# Patient Record
Sex: Male | Born: 1990 | Race: White | Hispanic: No | Marital: Single | Smoking: Never smoker
Health system: Southern US, Community
[De-identification: ages and names within clinical notes are randomized; demographics above are authoritative.]

## PROBLEM LIST (undated history)

## (undated) DIAGNOSIS — Z9889 Other specified postprocedural states: Secondary | ICD-10-CM

## (undated) DIAGNOSIS — S62009A Unspecified fracture of navicular [scaphoid] bone of unspecified wrist, initial encounter for closed fracture: Secondary | ICD-10-CM

## (undated) DIAGNOSIS — R112 Nausea with vomiting, unspecified: Secondary | ICD-10-CM

## (undated) DIAGNOSIS — Z8614 Personal history of Methicillin resistant Staphylococcus aureus infection: Secondary | ICD-10-CM

## (undated) HISTORY — PX: TONSILLECTOMY: SUR1361

---
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
## 2012-12-02 DIAGNOSIS — Z8614 Personal history of Methicillin resistant Staphylococcus aureus infection: Secondary | ICD-10-CM

## 2012-12-02 HISTORY — DX: Personal history of Methicillin resistant Staphylococcus aureus infection: Z86.14

## 2015-01-31 DIAGNOSIS — S62009A Unspecified fracture of navicular [scaphoid] bone of unspecified wrist, initial encounter for closed fracture: Secondary | ICD-10-CM

## 2015-01-31 HISTORY — DX: Unspecified fracture of navicular (scaphoid) bone of unspecified wrist, initial encounter for closed fracture: S62.009A

## 2015-02-04 ENCOUNTER — Emergency Department: Payer: Self-pay | Admitting: Emergency Medicine

## 2015-02-28 ENCOUNTER — Encounter (HOSPITAL_BASED_OUTPATIENT_CLINIC_OR_DEPARTMENT_OTHER): Payer: Self-pay | Admitting: *Deleted

## 2015-02-28 ENCOUNTER — Other Ambulatory Visit: Payer: Self-pay | Admitting: Physician Assistant

## 2015-03-01 NOTE — H&P (Signed)
  Madelin Weseman/[facility] [address]   [address] , [place] [id number] [phone] A Division of [facility]  Loreta Aveaniel F. Kenslee Achorn, M.D.   Robert A. Thurston HoleWainer, M.D.   Burnell BlanksW. Dan Caffrey, M.D.   Eulas PostJoshua P. Landau, M.D.   Lunette StandsAnna Voytek, M.D. Jewel Baizeimothy D. Eulah PontMurphy, M.D.  Buford DresserWesley R. Ibazebo, M.D.  Estell HarpinJames S. Kramer, M.D.    Melina Fiddlerebecca S. Bassett, M.D. Janalee DaneBrittney Kelly, PA- C  Mary L. Dub MikesStanbery, PA-C  Kirstin A. Shepperson, PA-C  Josh [place] Haver[place]hadwell, PA-C  WhitevilleBrandon Parry, [place] Dakota[place]A-C  RE: Baird CancerHumphries, Hilary   [phone]      DOB: May 30, 1991 INITIAL EVALUATION:  02-28-15  HPI: Reuel BoomDaniel is a new patient to the office.  He is a 24 year old male.  He sustained an injury at work on 02/04/2015.  Larey SeatFell off a ladder.  Vertical load dorsiflexion injury left wrist.  [facility] was seen at [facility] and x-rays were obtained showing a displaced fracture scaphoid junction proximal middle third.  He has been in a splint.  He presents today to discuss definitive treatment.  There was some issue about initial Worker's Comp. coverage which has been resolved.  He has already been told that he is going to need operative intervention.  No issues with this wrist prior to this traumatic event.  Otherwise good health.    Remaining history and general exam is outlined and included in the chart.    EXAMINATION: Specifically, he is a very healthy appearing 24 year old male, 6'6", 200 pounds.  Heart: regular rate and rhythm.  Lungs clear.  He has swelling and tenderness over the scaphoid on the left.  No gross deformity.  Neurovascularly intact. Very tender over the snuff box.  Other hand, full motion and no tenderness.    X-RAYS: I looked at his outside films showing a displaced, distracted fracture junction proximal middle third scaphoid.  I also got a scaphoid specific view today.  This remains displaced.  There is also a question of a nondisplaced fracture segmental a little bit further distal.  I can barely  see this on one view.     IMPRESSION & DISPOSITION: The nature of the injury and need for treatment outlined.  This is at high risk for nonunion as well as avascular necrosis no matter what is done.  The sooner this is reduced as much anatomically as possible and fixed the better his outcome.  I have discussed this with him.  My plan is to use a volar approach.  Try to this as much percutaneously as possible.  Unfortunately to get this aligned and reduced appropriately, I may have to open the wrist capsule.  Buried Acutrak screw.  Even with exacting, reduction and fixation, I remain concerned about how long this is going to take to heal as well as avascular necrosis.  The plan is to try to do this later this week.  Out of work from now until 6 weeks postoperatively.  Depending on the nature of the bone quality and how good fixation I can get, I will make a decision about clearance to return to light duty not using this hand at all prior to 6 weeks post-op.  I covered all of this with him.  Paperwork completed and all questions answered.  I will see him at the time of operative intervention.     Loreta Aveaniel F. Dellis Voght, M.D. Electronically verified by Loreta Aveaniel F. Chinelo Benn, M.D.  DFM:gde D 02-28-15 T 02-28-15 Cc:  Worker's Comp.

## 2015-03-02 ENCOUNTER — Encounter (HOSPITAL_BASED_OUTPATIENT_CLINIC_OR_DEPARTMENT_OTHER): Payer: Self-pay

## 2015-03-02 ENCOUNTER — Ambulatory Visit (HOSPITAL_BASED_OUTPATIENT_CLINIC_OR_DEPARTMENT_OTHER): Payer: Worker's Compensation | Admitting: Anesthesiology

## 2015-03-02 ENCOUNTER — Ambulatory Visit (HOSPITAL_BASED_OUTPATIENT_CLINIC_OR_DEPARTMENT_OTHER)
Admission: RE | Admit: 2015-03-02 | Discharge: 2015-03-02 | Disposition: A | Discharge status: Home / Self Care | Payer: Worker's Compensation | Source: Ambulatory Visit | Attending: Orthopedic Surgery | Admitting: Orthopedic Surgery

## 2015-03-02 ENCOUNTER — Encounter (HOSPITAL_BASED_OUTPATIENT_CLINIC_OR_DEPARTMENT_OTHER)
Admission: RE | Disposition: A | Discharge status: Home / Self Care | Payer: Self-pay | Source: Ambulatory Visit | Attending: Orthopedic Surgery

## 2015-03-02 DIAGNOSIS — F17210 Nicotine dependence, cigarettes, uncomplicated: Secondary | ICD-10-CM | POA: Insufficient documentation

## 2015-03-02 DIAGNOSIS — S62032A Displaced fracture of proximal third of navicular [scaphoid] bone of left wrist, initial encounter for closed fracture: Secondary | ICD-10-CM | POA: Insufficient documentation

## 2015-03-02 DIAGNOSIS — W11XXXA Fall on and from ladder, initial encounter: Secondary | ICD-10-CM | POA: Diagnosis not present

## 2015-03-02 HISTORY — DX: Unspecified fracture of navicular (scaphoid) bone of unspecified wrist, initial encounter for closed fracture: S62.009A

## 2015-03-02 HISTORY — DX: Personal history of Methicillin resistant Staphylococcus aureus infection: Z86.14

## 2015-03-02 HISTORY — DX: Other specified postprocedural states: Z98.890

## 2015-03-02 HISTORY — DX: Nausea with vomiting, unspecified: R11.2

## 2015-03-02 HISTORY — PX: ORIF SCAPHOID FRACTURE: SHX2130

## 2015-03-02 SURGERY — OPEN REDUCTION INTERNAL FIXATION (ORIF) SCAPHOID FRACTURE
Anesthesia: Regional | Site: Wrist | Laterality: Left

## 2015-03-02 MED ORDER — BUPIVACAINE-EPINEPHRINE (PF) 0.5% -1:200000 IJ SOLN
INTRAMUSCULAR | Status: DC | PRN
  Administered 2015-03-02: 25 mL via PERINEURAL

## 2015-03-02 MED ORDER — LACTATED RINGERS IV SOLN
INTRAVENOUS | Status: DC
  Administered 2015-03-02: 09:00:00 via INTRAVENOUS

## 2015-03-02 MED ORDER — ONDANSETRON HCL 4 MG/2ML IJ SOLN: 4.0000 mg | Freq: Once | INTRAMUSCULAR | Status: DC | PRN

## 2015-03-02 MED ORDER — PROPOFOL 10 MG/ML IV BOLUS
INTRAVENOUS | Status: AC
  Filled 2015-03-02: qty 20

## 2015-03-02 MED ORDER — MIDAZOLAM HCL 2 MG/2ML IJ SOLN
INTRAMUSCULAR | Status: AC
  Filled 2015-03-02: qty 2

## 2015-03-02 MED ORDER — METOCLOPRAMIDE HCL 5 MG/ML IJ SOLN
5.0000 mg | Freq: Three times a day (TID) | INTRAMUSCULAR | Status: DC | PRN
Start: 2015-03-02 — End: 2015-03-02

## 2015-03-02 MED ORDER — HYDROMORPHONE HCL 1 MG/ML IJ SOLN: 0.2500 mg | INTRAMUSCULAR | Status: DC | PRN

## 2015-03-02 MED ORDER — DEXAMETHASONE SODIUM PHOSPHATE 4 MG/ML IJ SOLN
INTRAMUSCULAR | Status: DC | PRN
  Administered 2015-03-02: 10 mg via INTRAVENOUS

## 2015-03-02 MED ORDER — CEFAZOLIN SODIUM-DEXTROSE 2-3 GM-% IV SOLR
2.0000 g | INTRAVENOUS | Status: AC
  Administered 2015-03-02: 2 g via INTRAVENOUS

## 2015-03-02 MED ORDER — METOCLOPRAMIDE HCL 5 MG PO TABS: 5.0000 mg | ORAL_TABLET | Freq: Three times a day (TID) | ORAL | Status: DC | PRN

## 2015-03-02 MED ORDER — CEFAZOLIN SODIUM-DEXTROSE 2-3 GM-% IV SOLR
INTRAVENOUS | Status: AC
  Filled 2015-03-02: qty 50

## 2015-03-02 MED ORDER — ONDANSETRON HCL 4 MG/2ML IJ SOLN
INTRAMUSCULAR | Status: DC | PRN
  Administered 2015-03-02: 4 mg via INTRAVENOUS

## 2015-03-02 MED ORDER — PROPOFOL 10 MG/ML IV BOLUS
INTRAVENOUS | Status: DC | PRN
  Administered 2015-03-02: 300 mg via INTRAVENOUS
  Administered 2015-03-02: 100 mg via INTRAVENOUS

## 2015-03-02 MED ORDER — LIDOCAINE HCL (CARDIAC) 20 MG/ML IV SOLN
INTRAVENOUS | Status: DC | PRN
  Administered 2015-03-02: 80 mg via INTRAVENOUS

## 2015-03-02 MED ORDER — FENTANYL CITRATE 0.05 MG/ML IJ SOLN
50.0000 ug | INTRAMUSCULAR | Status: DC | PRN
  Administered 2015-03-02: 100 ug via INTRAVENOUS

## 2015-03-02 MED ORDER — ONDANSETRON HCL 4 MG PO TABS: 4.0000 mg | ORAL_TABLET | Freq: Four times a day (QID) | ORAL | Status: DC | PRN

## 2015-03-02 MED ORDER — ONDANSETRON HCL 4 MG/2ML IJ SOLN: 4.0000 mg | Freq: Four times a day (QID) | INTRAMUSCULAR | Status: DC | PRN

## 2015-03-02 MED ORDER — FENTANYL CITRATE 0.05 MG/ML IJ SOLN
INTRAMUSCULAR | Status: AC
  Filled 2015-03-02: qty 2

## 2015-03-02 MED ORDER — CHLORHEXIDINE GLUCONATE 4 % EX LIQD
60.0000 mL | Freq: Once | CUTANEOUS | Status: DC
Start: 2015-03-02 — End: 2015-03-02

## 2015-03-02 MED ORDER — FENTANYL CITRATE 0.05 MG/ML IJ SOLN
INTRAMUSCULAR | Status: AC
  Filled 2015-03-02: qty 6

## 2015-03-02 MED ORDER — MIDAZOLAM HCL 2 MG/ML PO SYRP: 12.0000 mg | ORAL_SOLUTION | Freq: Once | ORAL | Status: DC | PRN

## 2015-03-02 MED ORDER — MIDAZOLAM HCL 2 MG/2ML IJ SOLN
1.0000 mg | INTRAMUSCULAR | Status: DC | PRN
  Administered 2015-03-02: 2 mg via INTRAVENOUS

## 2015-03-02 MED ORDER — OXYCODONE HCL 5 MG/5ML PO SOLN: 5.0000 mg | Freq: Once | ORAL | Status: DC | PRN

## 2015-03-02 MED ORDER — LACTATED RINGERS IV SOLN
INTRAVENOUS | Status: DC
  Administered 2015-03-02 (×2): via INTRAVENOUS

## 2015-03-02 MED ORDER — OXYCODONE-ACETAMINOPHEN 5-325 MG PO TABS
1.0000 | ORAL_TABLET | ORAL | Status: DC | PRN
Start: 2015-03-02 — End: 2015-03-02

## 2015-03-02 MED ORDER — HYDROMORPHONE HCL 1 MG/ML IJ SOLN
0.5000 mg | INTRAMUSCULAR | Status: DC | PRN
Start: 2015-03-02 — End: 2015-03-02

## 2015-03-02 MED ORDER — OXYCODONE HCL 5 MG PO TABS: 5.0000 mg | ORAL_TABLET | Freq: Once | ORAL | Status: DC | PRN

## 2015-03-02 SURGICAL SUPPLY — 64 items
BANDAGE ELASTIC 3 VELCRO ST LF (GAUZE/BANDAGES/DRESSINGS) ×2 IMPLANT
BANDAGE ELASTIC 4 VELCRO ST LF (GAUZE/BANDAGES/DRESSINGS) IMPLANT
BIT DRILL MINI LNG ACUTRAK 2 (BIT) ×1 IMPLANT
BLADE SURG 15 STRL LF DISP TIS (BLADE) ×1 IMPLANT
BLADE SURG 15 STRL SS (BLADE) ×1
BNDG COHESIVE 3X5 TAN STRL LF (GAUZE/BANDAGES/DRESSINGS) ×2 IMPLANT
BNDG ESMARK 4X9 LF (GAUZE/BANDAGES/DRESSINGS) ×2 IMPLANT
CANISTER SUCT 1200ML W/VALVE (MISCELLANEOUS) ×2 IMPLANT
COVER BACK TABLE 60X90IN (DRAPES) ×2 IMPLANT
CUFF TOURNIQUET SINGLE 18IN (TOURNIQUET CUFF) ×2 IMPLANT
DECANTER SPIKE VIAL GLASS SM (MISCELLANEOUS) IMPLANT
DRAPE EXTREMITY T 121X128X90 (DRAPE) ×2 IMPLANT
DRAPE OEC MINIVIEW 54X84 (DRAPES) ×2 IMPLANT
DRAPE SURG 17X23 STRL (DRAPES) ×2 IMPLANT
DRAPE U-SHAPE 47X51 STRL (DRAPES) IMPLANT
DRILL MINI LNG ACUTRAK 2 (BIT) ×2
DURAPREP 26ML APPLICATOR (WOUND CARE) ×2 IMPLANT
ELECT REM PT RETURN 9FT ADLT (ELECTROSURGICAL) ×2
ELECTRODE REM PT RTRN 9FT ADLT (ELECTROSURGICAL) ×1 IMPLANT
GAUZE SPONGE 4X4 12PLY STRL (GAUZE/BANDAGES/DRESSINGS) ×2 IMPLANT
GAUZE XEROFORM 1X8 LF (GAUZE/BANDAGES/DRESSINGS) ×2 IMPLANT
GLOVE BIOGEL M STRL SZ7.5 (GLOVE) ×2 IMPLANT
GLOVE BIOGEL PI IND STRL 7.0 (GLOVE) ×1 IMPLANT
GLOVE BIOGEL PI IND STRL 8 (GLOVE) ×1 IMPLANT
GLOVE BIOGEL PI INDICATOR 7.0 (GLOVE) ×1
GLOVE BIOGEL PI INDICATOR 8 (GLOVE) ×1
GLOVE ECLIPSE 7.0 STRL STRAW (GLOVE) ×2 IMPLANT
GLOVE ORTHO TXT STRL SZ7.5 (GLOVE) ×4 IMPLANT
GLOVE SURG ORTHO 8.0 STRL STRW (GLOVE) ×2 IMPLANT
GOWN STRL REUS W/ TWL LRG LVL3 (GOWN DISPOSABLE) ×2 IMPLANT
GOWN STRL REUS W/ TWL XL LVL3 (GOWN DISPOSABLE) ×2 IMPLANT
GOWN STRL REUS W/TWL LRG LVL3 (GOWN DISPOSABLE) ×2
GOWN STRL REUS W/TWL XL LVL3 (GOWN DISPOSABLE) ×4 IMPLANT
GUIDEWIRE ORTHO MINI ACTK .045 (WIRE) ×6 IMPLANT
NEEDLE HYPO 25X1 1.5 SAFETY (NEEDLE) IMPLANT
NS IRRIG 1000ML POUR BTL (IV SOLUTION) ×2 IMPLANT
PACK BASIN DAY SURGERY FS (CUSTOM PROCEDURE TRAY) ×2 IMPLANT
PAD CAST 3X4 CTTN HI CHSV (CAST SUPPLIES) ×2 IMPLANT
PAD CAST 4YDX4 CTTN HI CHSV (CAST SUPPLIES) IMPLANT
PADDING CAST ABS 4INX4YD NS (CAST SUPPLIES) ×1
PADDING CAST ABS COTTON 4X4 ST (CAST SUPPLIES) ×1 IMPLANT
PADDING CAST COTTON 3X4 STRL (CAST SUPPLIES) ×2
PADDING CAST COTTON 4X4 STRL (CAST SUPPLIES)
PENCIL BUTTON HOLSTER BLD 10FT (ELECTRODE) ×2 IMPLANT
SCREW ACUTRAK 2 MINI 26MM (Screw) ×2 IMPLANT
SHEET MEDIUM DRAPE 40X70 STRL (DRAPES) ×2 IMPLANT
SLEEVE SCD COMPRESS KNEE MED (MISCELLANEOUS) ×2 IMPLANT
SLING ARM FOAM STRAP LRG (SOFTGOODS) ×2 IMPLANT
SPLINT PLASTER CAST XFAST 3X15 (CAST SUPPLIES) IMPLANT
SPLINT PLASTER XTRA FASTSET 3X (CAST SUPPLIES)
SPONGE LAP 4X18 X RAY DECT (DISPOSABLE) IMPLANT
STAPLER VISISTAT 35W (STAPLE) IMPLANT
STOCKINETTE 4X48 STRL (DRAPES) ×2 IMPLANT
SUCTION FRAZIER TIP 10 FR DISP (SUCTIONS) IMPLANT
SUT ETHILON 3 0 PS 1 (SUTURE) IMPLANT
SUT VIC AB 2-0 SH 27 (SUTURE) ×1
SUT VIC AB 2-0 SH 27XBRD (SUTURE) ×1 IMPLANT
SUT VIC AB 3-0 SH 27 (SUTURE)
SUT VIC AB 3-0 SH 27X BRD (SUTURE) IMPLANT
SYR BULB 3OZ (MISCELLANEOUS) IMPLANT
SYR CONTROL 10ML LL (SYRINGE) IMPLANT
TOWEL OR 17X24 6PK STRL BLUE (TOWEL DISPOSABLE) ×2 IMPLANT
TUBE CONNECTING 20X1/4 (TUBING) IMPLANT
UNDERPAD 30X30 INCONTINENT (UNDERPADS AND DIAPERS) ×2 IMPLANT

## 2015-03-02 NOTE — Discharge Instructions (Signed)
Wear sling until block has worn off and thereafter for comfort.  Do not remove splint.  May shower, but do not let splint get wet.  Follow up appointment in one week   If you have a plaster or fiberglass splint or cast:  Do not try to scratch the skin under the cast using sharp or pointed objects.  Check the skin around the cast every day. You may put lotion on any red or sore areas.  Keep your cast or splint dry and clean.  Do not put pressure on any part of your cast or splint until it is fully hardened.  Your cast or splint can be protected during bathing with a plastic bag. Do not lower the cast or splint into water.  Take prescribed medication as directed. Only take over-the-counter or prescription medicines for pain, discomfort, or fever as directed by your caregiver. SEEK IMMEDIATE MEDICAL CARE IF:  You develop redness, swelling, numbness or increasing pain in the wound.  There is pus coming from the wound.  An unexplained oral temperature above 102 F (38.9 C) develops.  A bad smell is coming from the wound or dressing.  A breaking open of the wound (edges not staying together) occurs after stitches or staples have been removed. If you do not have a window in your cast for observing the wound, a discharge or minor bleeding may show up as a stain on the outside of your cast immediately after surgery. Report these findings to your caregiver. Document Released: 06/07/2005 Document Revised: 09/08/2013 Document Reviewed: 05/30/2009 [facility] Patient Information 2015 [place], [place]. This information is not intended to replace advice given to you by your health care provider. Make sure you discuss any questions you have with your health care provider.    Post Anesthesia Home Care Instructions  Activity: Get plenty of rest for the remainder of the day. A responsible adult should stay with you for 24 hours following the procedure.  For the next 24 hours, DO NOT: -Drive a  car -Advertising copywriter -Drink alcoholic beverages -Take any medication unless instructed by your physician -Make any legal decisions or sign important papers.  Meals: Start with liquid foods such as gelatin or soup. Progress to regular foods as tolerated. Avoid greasy, spicy, heavy foods. If nausea and/or vomiting occur, drink only clear liquids until the nausea and/or vomiting subsides. Call your physician if vomiting continues.  Special Instructions/Symptoms: Your throat may feel dry or sore from the anesthesia or the breathing tube placed in your throat during surgery. If this causes discomfort, gargle with warm salt water. The discomfort should disappear within 24 hours.  If you had a scopolamine patch placed behind your ear for the management of post- operative nausea and/or vomiting:  1. The medication in the patch is effective for 72 hours, after which it should be removed.  Wrap patch in a tissue and discard in the trash. Wash hands thoroughly with soap and water. 2. You may remove the patch earlier than 72 hours if you experience unpleasant side effects which may include dry mouth, dizziness or visual disturbances. 3. Avoid touching the patch. Wash your hands with soap and water after contact with the patch.   Regional Anesthesia Blocks  1. Numbness or the inability to move the "blocked" extremity may last from 3-48 hours after placement. The length of time depends on the medication injected and your individual response to the medication. If the numbness is not going away after 48 hours, call your surgeon.  2. The extremity that is blocked will need to be protected until the numbness is gone and the  Strength has returned. Because you cannot feel it, you will need to take extra care to avoid injury. Because it may be weak, you may have difficulty moving it or using it. You may not know what position it is in without looking at it while the block is in effect.  3. For blocks in the  legs and feet, returning to weight bearing and walking needs to be done carefully. You will need to wait until the numbness is entirely gone and the strength has returned. You should be able to move your leg and foot normally before you try and bear weight or walk. You will need someone to be with you when you first try to ensure you do not fall and possibly risk injury.  4. Bruising and tenderness at the needle site are common side effects and will resolve in a few days.  5. Persistent numbness or new problems with movement should be communicated to the surgeon or the [facility] [phone])/ [facility] [phone]).

## 2015-03-02 NOTE — Anesthesia Postprocedure Evaluation (Signed)
  Anesthesia Post-op Note  Patient: Derek [name]  Procedure(s) Performed: Procedure(s) with comments: LEFT WRIST OPEN REDUCTION INTERNAL FIXATION (ORIF) SCAPHOID  (Left) - ANESTHESIA: GENERAL, BLOCK  Patient Location: PACU  Anesthesia Type: General, Regional   Level of Consciousness: awake, alert  and oriented  Airway and Oxygen Therapy: Patient Spontanous Breathing  Post-op Pain: mild  Post-op Assessment: Post-op Vital signs reviewed  Post-op Vital Signs: Reviewed  Last Vitals:  Filed Vitals:   03/02/15 1201  BP:   Pulse: 86  Temp: 36.8 C  Resp: 15    Complications: No apparent anesthesia complications

## 2015-03-02 NOTE — Anesthesia Procedure Notes (Addendum)
Anesthesia Regional Block:  Supraclavicular block  Pre-Anesthetic Checklist: ,, timeout performed, Correct Patient, Correct Site, Correct Laterality, Correct Procedure, Correct Position, site marked, Risks and benefits discussed,  Surgical consent,  Pre-op evaluation,  At surgeon's request and post-op pain management  Laterality: Left and Upper  Prep: chloraprep       Needles:  Injection technique: Single-shot  Needle Type: Echogenic Stimulator Needle     Needle Length: 5cm 5 cm Needle Gauge: 21 and 21 G    Additional Needles:  Procedures: ultrasound guided (picture in chart) Supraclavicular block Narrative:  Start time: 03/02/2015 9:20 AM End time: 03/02/2015 9:26 AM Injection made incrementally with aspirations every 5 mL.  Performed by: Personally  Anesthesiologist: CREWS, DAVID   Procedure Name: LMA Insertion Date/Time: 03/02/2015 11:08 AM Performed by: Curly ShoresRAFT, Friedrich Harriott W Pre-anesthesia Checklist: Patient identified, Emergency Drugs available, Suction available and Patient being monitored Patient Re-evaluated:Patient Re-evaluated prior to inductionOxygen Delivery Method: Circle System Utilized Preoxygenation: Pre-oxygenation with 100% oxygen Intubation Type: IV induction Ventilation: Mask ventilation without difficulty LMA: LMA inserted LMA Size: 5.0 Number of attempts: 2 Airway Equipment and Method: Bite block Placement Confirmation: positive ETCO2 and breath sounds checked- equal and bilateral Tube secured with: Tape Dental Injury: Teeth and Oropharynx as per pre-operative assessment

## 2015-03-02 NOTE — Anesthesia Preprocedure Evaluation (Signed)
Anesthesia Evaluation  Patient identified by MRN, date of birth, ID band  Reviewed: Allergy & Precautions, NPO status , Patient's Chart, lab work & pertinent test results  Airway Mallampati: I  TM Distance: >3 FB Neck ROM: Full    Dental  (+) Teeth Intact, Dental Advisory Given   Pulmonary Current Smoker,  breath sounds clear to auscultation        Cardiovascular Rhythm:Regular Rate:Normal     Neuro/Psych    GI/Hepatic   Endo/Other    Renal/GU      Musculoskeletal   Abdominal   Peds  Hematology   Anesthesia Other Findings   Reproductive/Obstetrics                             Anesthesia Physical Anesthesia Plan  ASA: I  Anesthesia Plan: General   Post-op Pain Management:    Induction: Intravenous  Airway Management Planned: LMA  Additional Equipment:   Intra-op Plan:   Post-operative Plan: Extubation in OR  Informed Consent: I have reviewed the patients History and Physical, chart, labs and discussed the procedure including the risks, benefits and alternatives for the proposed anesthesia with the patient or authorized representative who has indicated his/her understanding and acceptance.   Dental advisory given  Plan Discussed with: CRNA, Anesthesiologist and Surgeon  Anesthesia Plan Comments:         Anesthesia Quick Evaluation

## 2015-03-02 NOTE — Interval H&P Note (Signed)
History and Physical Interval Note:  03/02/2015 8:41 AM  Derek [name]  has presented today for surgery, with the diagnosis of UNSPECIFIED FRACTURE OF NAVICULAR (SCAPHOID) BONE OF UNSPECIFIED WRIST INITIAL ENCOUNTER FOR CLOSED FRACTURE  The various methods of treatment have been discussed with the patient and family. After consideration of risks, benefits and other options for treatment, the patient has consented to  Procedure(s) with comments: LEFT WRIST OPEN REDUCTION INTERNAL FIXATION (ORIF) SCAPHOID  (Left) - ANESTHESIA: GENERAL, BLOCK as a surgical intervention .  The patient's history has been reviewed, patient examined, no change in status, stable for surgery.  I have reviewed the patient's chart and labs.  Questions were answered to the patient's satisfaction.     Brendyn Mclaren,Xaviar F

## 2015-03-02 NOTE — Progress Notes (Signed)
Assisted Dr. Crews with left, ultrasound guided, interscalene  block. Side rails up, monitors on throughout procedure. See vital signs in flow sheet. Tolerated Procedure well. 

## 2015-03-02 NOTE — Transfer of Care (Signed)
Immediate Anesthesia Transfer of Care Note  Patient: Lovell SheehanDaniel T Delaluz  Procedure(s) Performed: Procedure(s) with comments: LEFT WRIST OPEN REDUCTION INTERNAL FIXATION (ORIF) SCAPHOID  (Left) - ANESTHESIA: GENERAL, BLOCK  Patient Location: PACU  Anesthesia Type:GA combined with regional for post-op pain  Level of Consciousness: awake, alert  and oriented  Airway & Oxygen Therapy: Patient Spontanous Breathing and Patient connected to face mask oxygen  Post-op Assessment: Report given to RN, Post -op Vital signs reviewed and stable and Patient moving all extremities  Post vital signs: Reviewed and stable  Last Vitals:  Filed Vitals:   03/02/15 1201  BP:   Pulse: 86  Temp: 36.8 C  Resp: 15    Complications: No apparent anesthesia complications

## 2015-03-03 NOTE — Op Note (Signed)
NAMGlean Salen:  [name], Derek             ACCOUNT NO.:  [id number]  MEDICAL RECORD NO.:  [id number]  LOCATION:                                 FACILITY:  PHYSICIAN:  Loreta Aveaniel F. Murphy, M.D. DATE OF BIRTH:  12-Aug-1991  DATE OF PROCEDURE:  03/02/2015 DATE OF DISCHARGE:  03/02/2015                              OPERATIVE REPORT   PREOPERATIVE DIAGNOSIS:  Displaced fracture, scaphoid left wrist. Closed.  Junction proximal middle third.  POSTOPERATIVE DIAGNOSIS:  Displaced fracture, scaphoid left wrist. Closed.  Junction proximal middle third.  PROCEDURE:  Open reduction internal fixation with a buried 26 mm cannulated mini Acutrak screw.  SURGEON:  Loreta Aveaniel F. Murphy, MD.  ASSISTANT:  Mikey KirschnerLindsey Stanberry, PA-C  ANESTHESIA:  General.  BLOOD LOSS:  Minimal.  SPECIMENS:  None.  CULTURES:  None.  COMPLICATIONS:  None.  DRESSINGS:  Soft compressive thumb spica splint.  TOURNIQUET TIME:  45 minutes.  DESCRIPTION OF PROCEDURE:  The patient was brought to the operating room, placed on the operating table in supine position.  After adequate anesthesia had been obtained, tourniquet applied.  Prepped and draped in usual sterile fashion.  Fluoroscopic guidance throughout.  I was able to manipulate and reduce the fracture into very good position.  This was approached with a volar dorsal incision.  Skin and subcutaneous tissue divided.  Holding the fracture reduced.  The guidewire was passed all across the fracture as far proximal as possible as this was a very proximal fracture.  After determining good alignment and measuring and drilling, it was then fixed with a 26 mm screw countersunk.  This gave good capturing, good fixation, good compression.  Placing as many __________ screw into the fracture fragment as possible.  Very pleased with alignment and stability.  Wound irrigated and closed with nylon. Sterile compressive dressing applied.  Tourniquet inflated and removed. Thumb spica splint  applied.  Anesthesia reversed.  Brought to the recovery room.  Tolerated the surgery well.  No complications.     Loreta Aveaniel F. Murphy, M.D.     DFM/MEDQ  D:  03/02/2015  T:  03/03/2015  Job:  [id number]

## 2015-12-27 DIAGNOSIS — S62009A Unspecified fracture of navicular [scaphoid] bone of unspecified wrist, initial encounter for closed fracture: Secondary | ICD-10-CM | POA: Insufficient documentation

## 2016-01-03 ENCOUNTER — Emergency Department
Admission: EM | Admit: 2016-01-03 | Discharge: 2016-01-03 | Disposition: A | Discharge status: Home / Self Care | Payer: 59 | Attending: Emergency Medicine | Admitting: Emergency Medicine

## 2016-01-03 ENCOUNTER — Encounter: Payer: Self-pay | Admitting: Emergency Medicine

## 2016-01-03 DIAGNOSIS — R110 Nausea: Secondary | ICD-10-CM

## 2016-01-03 DIAGNOSIS — F17210 Nicotine dependence, cigarettes, uncomplicated: Secondary | ICD-10-CM | POA: Insufficient documentation

## 2016-01-03 DIAGNOSIS — K5900 Constipation, unspecified: Secondary | ICD-10-CM | POA: Diagnosis not present

## 2016-01-03 DIAGNOSIS — R1084 Generalized abdominal pain: Secondary | ICD-10-CM | POA: Diagnosis not present

## 2016-01-03 DIAGNOSIS — R109 Unspecified abdominal pain: Secondary | ICD-10-CM | POA: Diagnosis present

## 2016-01-03 DIAGNOSIS — R1013 Epigastric pain: Secondary | ICD-10-CM

## 2016-01-03 LAB — URINALYSIS COMPLETE WITH MICROSCOPIC (ARMC ONLY)
Bilirubin Urine: NEGATIVE
Glucose, UA: NEGATIVE mg/dL
HGB URINE DIPSTICK: NEGATIVE
Ketones, ur: NEGATIVE mg/dL
Leukocytes, UA: NEGATIVE
Nitrite: NEGATIVE
PH: 6 (ref 5.0–8.0)
PROTEIN: NEGATIVE mg/dL
Specific Gravity, Urine: 1.026 (ref 1.005–1.030)

## 2016-01-03 LAB — COMPREHENSIVE METABOLIC PANEL
ALBUMIN: 4.8 g/dL (ref 3.5–5.0)
ALT: 15 U/L — ABNORMAL LOW (ref 17–63)
AST: 18 U/L (ref 15–41)
Alkaline Phosphatase: 63 U/L (ref 38–126)
Anion gap: 6 (ref 5–15)
BILIRUBIN TOTAL: 0.7 mg/dL (ref 0.3–1.2)
BUN: 12 mg/dL (ref 6–20)
CALCIUM: 9.3 mg/dL (ref 8.9–10.3)
CO2: 29 mmol/L (ref 22–32)
Chloride: 104 mmol/L (ref 101–111)
Creatinine, Ser: 0.95 mg/dL (ref 0.61–1.24)
GFR calc Af Amer: >60 mL/min (ref >60)
GLUCOSE: 97 mg/dL (ref 65–99)
POTASSIUM: 3.9 mmol/L (ref 3.5–5.1)
Sodium: 139 mmol/L (ref 135–145)
Total Protein: 7.6 g/dL (ref 6.5–8.1)

## 2016-01-03 LAB — CBC
HCT: 40.5 % (ref 40.0–52.0)
HEMOGLOBIN: 14 g/dL (ref 13.0–18.0)
MCH: 30.5 pg (ref 26.0–34.0)
MCHC: 34.5 g/dL (ref 32.0–36.0)
MCV: 88.5 fL (ref 80.0–100.0)
Platelets: 229 10*3/uL (ref 150–440)
RBC: 4.58 MIL/uL (ref 4.40–5.90)
RDW: 12.7 % (ref 11.5–14.5)
WBC: 7.9 10*3/uL (ref 3.8–10.6)

## 2016-01-03 LAB — LIPASE, BLOOD: Lipase: 30 U/L (ref 11–51)

## 2016-01-03 MED ORDER — GI COCKTAIL ~~LOC~~
30.0000 mL | Freq: Once | ORAL | Status: AC
  Administered 2016-01-03: 30 mL via ORAL
  Filled 2016-01-03 (×2): qty 30

## 2016-01-03 MED ORDER — ONDANSETRON 4 MG PO TBDP: 4.0000 mg | ORAL_TABLET | Freq: Three times a day (TID) | ORAL | Status: DC | PRN

## 2016-01-03 MED ORDER — OMEPRAZOLE 40 MG PO CPDR: 40.0000 mg | DELAYED_RELEASE_CAPSULE | Freq: Every day | ORAL | Status: DC

## 2016-01-03 NOTE — ED Notes (Signed)
Pt in with co generalized abd pain since Sunday no BM since Saturday.  No vomiting, no dysuria.

## 2016-01-03 NOTE — ED Provider Notes (Signed)
[facility] Emergency Department Provider Note  ____________________________________________  Time seen: Approximately 3:13 PM  I have reviewed the triage vital signs and the nursing notes.   HISTORY  Chief Complaint Abdominal Pain    HPI Derek [name] is a 25 y.o. male , otherwise healthy, presenting with epigastric pain x 5d.  Patient reports a constant "dull and sharp" pain in the epigastrium since Saturday. It is worse with eating but is unchanged with laying down.He has associated belching, although the belching does not improve the pain. He has had no nausea or vomiting. Last BM as Saturday but he is passing gas. He tried a laxative and a stool softener without improvement.No fever, chills, chest pain or shortness of breath, lightheadedness or syncope. Patient has been eating and drinking normally, but states that when he swallows he feels like "things might get stuck."   Past Medical History  Diagnosis Date  . PONV (postoperative nausea and vomiting)   . Fracture of scaphoid bone of wrist 01/2015    left navicular(scaphoid) fx.  . History of MRSA infection 2014    knee    There are no active problems to display for this patient.   Past Surgical History  Procedure Laterality Date  . Tonsillectomy    . Orif scaphoid fracture Left 03/02/2015    Procedure: LEFT WRIST OPEN REDUCTION INTERNAL FIXATION (ORIF) SCAPHOID ;  Surgeon: Mckinley Jewel, MD;  Location: [facility];  Service: Orthopedics;  Laterality: Left;  ANESTHESIA: GENERAL, BLOCK    Current Outpatient Rx  Name  Route  Sig  Dispense  Refill  . omeprazole (PRILOSEC) 40 MG capsule   Oral   Take 1 capsule (40 mg total) by mouth daily.   30 capsule   0   . ondansetron (ZOFRAN ODT) 4 MG disintegrating tablet   Oral   Take 1 tablet (4 mg total) by mouth every 8 (eight) hours as needed for nausea or vomiting.   20 tablet   0     Allergies Review of patient's allergies  indicates no known allergies.  History reviewed. No pertinent family history.  Social History Social History  Substance Use Topics  . Smoking status: Current Every Day Smoker -- 3 years    Types: Cigarettes  . Smokeless tobacco: Never Used     Comment: 1-2 cig./day  . Alcohol Use: Yes     Comment: once/week    Review of Systems Constitutional: No fever/chills No lightheadedness or syncope. Eyes: No visual changes. ENT: No sore throat. Cardiovascular: Denies chest pain, palpitations. Respiratory: Denies shortness of breath.  No cough. Gastrointestinal: Positive epigastric abdominal pain.  No nausea, no vomiting.  No diarrhea.  positiveconstipation. Genitourinary: Negative for dysuria.no scrotal swelling or pain. No discharge from the penis. Musculoskeletal: Negative for back pain. Skin: Negative for rash. Neurological: Negative for headaches, focal weakness or numbness.  10-point ROS otherwise negative.  ____________________________________________   PHYSICAL EXAM:  VITAL SIGNS: ED Triage Vitals  Enc Vitals Group     BP 01/03/16 1054 119/83 mmHg     Pulse Rate 01/03/16 1054 78     Resp 01/03/16 1054 18     Temp 01/03/16 1054 98 F (36.7 C)     Temp Source 01/03/16 1054 Oral     SpO2 01/03/16 1054 100 %     Weight 01/03/16 1054 215 lb (97.523 kg)     Height 01/03/16 1054  (1.981 m)     Head Cir --  Peak Flow --      Pain Score 01/03/16 1058 7     Pain Loc --      Pain Edu? --      Excl. in GC? --     Constitutional: Alert and oriented. Well appearing and in no acute distress. Answer question appropriately.moves about the room comfortably. Eyes: Conjunctivae are normal.  EOMI.o scleral icterus Head: Atraumatic. Nose: No congestion/rhinnorhea. Mouth/Throat: Mucous membranes are moist.  Neck: No stridor.  Supple.   Cardiovascular: Normal rate, regular rhythm. No murmurs, rubs or gallops.  Respiratory: Normal respiratory effort.  No retractions. Lungs  CTAB.  No wheezes, rales or ronchi. Gastrointestinal: abdomen is soft and nondistended. Patient has epigastric greater than left upper and left lower quadrant pain. No peritoneal signs, guarding or rebound. No Murphy sign. Musculoskeletal: No LE edema.  Neurologic:  Normal speech and language. No gross focal neurologic deficits are appreciated.  Skin:  Skin is warm, dry and intact. No rash noted. Psychiatric: Mood and affect are normal. Speech and behavior are normal.  Normal judgement.  ____________________________________________   LABS (all labs ordered are listed, but only abnormal results are displayed)  Labs Reviewed - No data to display ____________________________________________  EKG  See note below. ____________________________________________  RADIOLOGY  No results found.  ____________________________________________   PROCEDURES  Procedure(s) performed: None  Critical Care performed: No ____________________________________________   INITIAL IMPRESSION / ASSESSMENT AND PLAN / ED COURSE  Pertinent labs & imaging results that were available during my care of the patient were reviewed by me and considered in my medical decision making (see chart for details).  25 y.o. M, otherwise healthy, presenting w/ epigastric pain x 5d.  Given the pt pain is worse with food and he has a sensation of things getting stuck, it is possible that this patient has GERD or PUD.  The pt does have constipation which may explain his LLQ ttp - I have offered an enema, but he has refused.  There is no evidence for acute surgical pathology on my exam.  Plan basic labs, and re-evaluation about symptomatic management.  Anticipate d/c home w/ GERD precautionary diet, PPI, and close PMD f/u.  ----------------------------------------- 4:15 PM on 01/03/2016 -----------------------------------------  The patient had blood work and an EKG performed when he registered to be seen yesterday. He did  not see a physician, but his EKG shows a rate in the 70s with an incomplete right bundle-branch block but otherwise no ischemic changes. His lab work is all within normal limits including blood counts, and electrolytes. He also has a urinalysis was does not show any evidence of infection. At this time, I'll plan to discharge the patient home with treatment for GERD, and have him follow-up with his primary care physician in the next 5-7 days. He understands return precautions as well as follow-up instructions.  ____________________________________________  FINAL CLINICAL IMPRESSION(S) / ED DIAGNOSES  Final diagnoses:  Epigastric pain  Nausea  Constipation, unspecified constipation type      NEW MEDICATIONS STARTED DURING THIS VISIT:  There are no discharge medications for this patient.    Rockne Menghini, MD 01/13/16 1544

## 2016-01-03 NOTE — Discharge Instructions (Signed)
Please take the omeprazole as directed. Take food choices as indicated on the sheets attached. Make a follow-up appointment with a primary care physician for further evaluation.  Return to the emergency department if you develop severe pain, inability to keep down fluids, fever, or any other symptoms concerning to you.

## 2016-01-03 NOTE — ED Notes (Signed)
Pt to ed with c/o abd pain,  States he was here yesterday for same but left before being seen.  Pt states pain has continued this am.

## 2016-11-11 ENCOUNTER — Other Ambulatory Visit: Payer: Self-pay | Admitting: Family Medicine

## 2018-05-22 ENCOUNTER — Encounter: Payer: Self-pay | Admitting: Family Medicine

## 2018-05-22 ENCOUNTER — Ambulatory Visit: Payer: Self-pay | Admitting: Family

## 2018-05-22 ENCOUNTER — Ambulatory Visit (INDEPENDENT_AMBULATORY_CARE_PROVIDER_SITE_OTHER): Payer: 59 | Admitting: Family Medicine

## 2018-05-22 ENCOUNTER — Ambulatory Visit: Payer: Self-pay | Admitting: Family Medicine

## 2018-05-22 VITALS — BP 120/70 | HR 73 | Temp 98.3°F | Ht 76.75 in | Wt 216.5 lb

## 2018-05-22 DIAGNOSIS — S6992XA Unspecified injury of left wrist, hand and finger(s), initial encounter: Secondary | ICD-10-CM

## 2018-05-22 DIAGNOSIS — T148XXA Other injury of unspecified body region, initial encounter: Secondary | ICD-10-CM
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
## 2018-05-22 NOTE — Patient Instructions (Signed)
Good to see you today  Remove your brace several times a day and do gentle range of motion exercises  Keep abrasions clean and dry, leave open to air if able  If worsening pain, swelling, weakness of hand/wrist, please let me know

## 2018-05-27 ENCOUNTER — Ambulatory Visit (INDEPENDENT_AMBULATORY_CARE_PROVIDER_SITE_OTHER): Payer: 59 | Admitting: Family Medicine

## 2018-05-27 ENCOUNTER — Encounter: Payer: Self-pay | Admitting: Family Medicine

## 2018-05-27 VITALS — BP 102/60 | HR 86 | Temp 98.2°F | Ht 77.0 in | Wt 211.5 lb

## 2018-05-27 DIAGNOSIS — Z13220 Encounter for screening for lipoid disorders: Secondary | ICD-10-CM

## 2018-05-27 DIAGNOSIS — Z23 Encounter for immunization: Secondary | ICD-10-CM | POA: Diagnosis not present

## 2018-05-27 DIAGNOSIS — Z0000 Encounter for general adult medical examination without abnormal findings: Secondary | ICD-10-CM

## 2018-05-27 LAB — LIPID PANEL
CHOL/HDL RATIO: 2
Cholesterol: 106 mg/dL (ref 0–200)
HDL: 53.5 mg/dL (ref >39.00)
LDL Cholesterol: 40 mg/dL (ref 0–99)
NONHDL: 52.74
Triglycerides: 63 mg/dL (ref 0.0–149.0)
VLDL: 12.6 mg/dL (ref 0.0–40.0)

## 2018-05-27 NOTE — Progress Notes (Signed)
Subjective:    Patient ID: Derek SheehanDaniel T Eakins, male    DOB: August 11, 1991, 27 y.o.   MRN: [id number]  HPI This is a 27 yo male who presents today to establish care and for CPE. Lives with his girlfriend, has 2 cats. Works at [employer].   Last CPE- a couple of years ago.  Tdap- can not recall Flu- sometimes Dental- regular Eye- not recently Exercise- regular Sexuality- about 20 lifetime partners, has had STI testing in past. Declines testing today.    Past Medical History:  Diagnosis Date  . Fracture of scaphoid bone of wrist 01/2015   left navicular(scaphoid) fx.  . History of MRSA infection 2014   knee  . PONV (postoperative nausea and vomiting)    Past Surgical History:  Procedure Laterality Date  . ORIF SCAPHOID FRACTURE Left 03/02/2015   Procedure: LEFT WRIST OPEN REDUCTION INTERNAL FIXATION (ORIF) SCAPHOID ;  Surgeon: Mckinley Jewelaniel Murphy, MD;  Location: [facility];  Service: Orthopedics;  Laterality: Left;  ANESTHESIA: GENERAL, BLOCK  . TONSILLECTOMY     No family history on file. Social History   Tobacco Use  . Smoking status: Former Smoker    Years: 3.00    Types: Cigarettes  . Smokeless tobacco: Never Used  . Tobacco comment: 1-2 cig./day  Substance Use Topics  . Alcohol use: Yes    Comment: once/week  . Drug use: No      Review of Systems  Constitutional: Negative.   HENT: Negative.   Eyes: Negative.   Respiratory: Negative.   Cardiovascular: Negative.   Gastrointestinal: Negative.   Endocrine: Negative.   Genitourinary: Negative.   Musculoskeletal:       Left wrist pain occasionally due to recent sprain. Wears brace. Takes occasional ibuprofen.   Skin: Negative.   Allergic/Immunologic: Negative.   Neurological: Negative.   Hematological: Negative.   Psychiatric/Behavioral: Negative.        Objective:   Physical Exam Physical Exam  Constitutional: He is oriented to person, place, and time. He appears well-developed and well-nourished.    HENT:  Head: Normocephalic and atraumatic.  Right Ear: External ear normal.  Left Ear: External ear normal.  Nose: Nose normal.  Mouth/Throat: Oropharynx is clear and moist.  Eyes: Conjunctivae are normal. Pupils are equal, round, and reactive to light.  Neck: Normal range of motion. Neck supple.  Cardiovascular: Normal rate, regular rhythm, normal heart sounds and intact distal pulses.   Pulmonary/Chest: Effort normal and breath sounds normal.  Abdominal: Soft. Bowel sounds are normal. Hernia confirmed negative in the right inguinal area and confirmed negative in the left inguinal area.  Genitourinary: Testes normal and penis normal. Circumcised.  Musculoskeletal: Normal range of motion. He exhibits no edema or tenderness.       Cervical back: Normal.       Thoracic back: Normal.       Lumbar back: Normal.  Lymphadenopathy:    He has no cervical adenopathy.       Right: No inguinal adenopathy present.       Left: No inguinal adenopathy present.  Neurological: He is alert and oriented to person, place, and time. He has normal reflexes.  Skin: Skin is warm and dry.  Psychiatric: He has a normal mood and affect. His behavior is normal. Judgment normal.  Vitals reviewed.     BP 102/60 (BP Location: Left Arm, Patient Position: Sitting, Cuff Size: Normal)   Pulse 86   Temp 98.2 F (36.8 C) (Oral)   Ht  6\' 5"  (1.956 m)   Wt 211 lb 8 oz (95.9 kg)   SpO2 97%   BMI 25.08 kg/m  Wt Readings from Last 3 Encounters:  05/27/18 211 lb 8 oz (95.9 kg)  05/22/18 216 lb 8 oz (98.2 kg)  01/03/16 215 lb (97.5 kg)   Depression screen PHQ 2/9 05/27/2018  Decreased Interest 0  Down, Depressed, Hopeless 0  PHQ - 2 Score 0       Assessment & Plan:  .1. Annual physical exam - Discussed and encouraged healthy lifestyle choices- adequate sleep, regular exercise, stress management and healthy food choices.  - Return in 1 year for annual exam  2. Screening for lipid disorders - Lipid  Panel  3. Need for Tdap vaccination - Tdap vaccine greater than or equal to 7yo IM   [name], FNP-BC   [facility] at [facility], [facility]  05/27/2018 12:31 PM

## 2018-05-27 NOTE — Patient Instructions (Signed)
Good to see you today  Return in 1 year for annual exam   Keeping you healthy  Get these tests  Blood pressure- Have your blood pressure checked once a year by your healthcare provider.  Normal blood pressure is 120/80.  Weight- Have your body mass index (BMI) calculated to screen for obesity.  BMI is a measure of body fat based on height and weight. You can also calculate your own BMI at https://www.west-esparza.com/www.[email or web address]/bmi/.  Cholesterol- Have your cholesterol checked regularly starting at age 10235, sooner may be necessary if you have diabetes, high blood pressure, if a family member developed heart diseases at an early age or if you smoke.   Chlamydia, HIV, and other sexual transmitted disease- Get screened each year until the age of 25 then within three months of each new sexual partner.  Diabetes- Have your blood sugar checked regularly if you have high blood pressure, high cholesterol, a family history of diabetes or if you are overweight.  Get these vaccines  Flu shot- Every fall.  Tetanus shot- Every 10 years.  Menactra- Single dose; prevents meningitis.  Take these steps  Don't smoke- If you do smoke, ask your healthcare provider about quitting. For tips on how to quit, go to www.smokefree.gov or call 1-800-QUIT-NOW.  Be physically active- Exercise 5 days a week for at least 30 minutes.  If you are not already physically active start slow and gradually work up to 30 minutes of moderate physical activity.  Examples of moderate activity include walking briskly, mowing the yard, dancing, swimming bicycling, etc.  Eat a healthy diet- Eat a variety of healthy foods such as fruits, vegetables, low fat milk, low fat cheese, yogurt, lean meats, poultry, fish, beans, tofu, etc.  For more information on healthy eating, go to www.thenutritionsource.org  Drink alcohol in moderation- Limit alcohol intake two drinks or less a day.  Never drink and drive.  Dentist- Brush and floss teeth twice daily;  visit your dentis twice a year.  Depression-Your emotional health is as important as your physical health.  If you're feeling down, losing interest in things you normally enjoy please talk with your healthcare provider.  Gun Safety- If you keep a gun in your home, keep it unloaded and with the safety lock on.  Bullets should be stored separately.  Helmet use- Always wear a helmet when riding a motorcycle, bicycle, rollerblading or skateboarding.  Safe sex- If you may be exposed to a sexually transmitted infection, use a condom  Seat belts- Seat bels can save your life; always wear one.  Smoke/Carbon Monoxide detectors- These detectors need to be installed on the appropriate level of your home.  Replace batteries at least once a year.  Skin Cancer- When out in the sun, cover up and use sunscreen SPF 15 or higher.  Violence- If anyone is threatening or hurting you, please tell your healthcare provider.

## 2018-05-29 ENCOUNTER — Encounter: Payer: Self-pay | Admitting: Family Medicine

## 2019-02-05 ENCOUNTER — Ambulatory Visit (INDEPENDENT_AMBULATORY_CARE_PROVIDER_SITE_OTHER): Payer: 59 | Admitting: Family Medicine

## 2019-02-05 ENCOUNTER — Encounter: Payer: Self-pay | Admitting: Family Medicine

## 2019-02-05 VITALS — BP 112/64 | HR 94 | Temp 98.1°F | Ht 77.0 in | Wt 222.8 lb

## 2019-02-05 DIAGNOSIS — S39012A Strain of muscle, fascia and tendon of lower back, initial encounter: Secondary | ICD-10-CM | POA: Diagnosis not present

## 2019-02-05 NOTE — Patient Instructions (Signed)
Good to see you today! If you are still experiencing back pain after the prednisone is completed, you may take 2-3 ibuprofen/Advil every eight hours for back pain. If pain worsens or you develop numbness, tingling, weakness, please follow up with our office.   Back Exercises If you have pain in your back, do these exercises 2-3 times each day or as told by your doctor. When the pain goes away, do the exercises once each day, but repeat the steps more times for each exercise (do more repetitions). If you do not have pain in your back, do these exercises once each day or as told by your doctor. Exercises Single Knee to Chest Do these steps 3-5 times in a row for each leg: 1. Lie on your back on a firm bed or the floor with your legs stretched out. 2. Bring one knee to your chest. 3. Hold your knee to your chest by grabbing your knee or thigh. 4. Pull on your knee until you feel a gentle stretch in your lower back. 5. Keep doing the stretch for 10-30 seconds. 6. Slowly let go of your leg and straighten it. Pelvic Tilt Do these steps 5-10 times in a row: 1. Lie on your back on a firm bed or the floor with your legs stretched out. 2. Bend your knees so they point up to the ceiling. Your feet should be flat on the floor. 3. Tighten your lower belly (abdomen) muscles to press your lower back against the floor. This will make your tailbone point up to the ceiling instead of pointing down to your feet or the floor. 4. Stay in this position for 5-10 seconds while you gently tighten your muscles and breathe evenly. Cat-Cow Do these steps until your lower back bends more easily: 1. Get on your hands and knees on a firm surface. Keep your hands under your shoulders, and keep your knees under your hips. You may put padding under your knees. 2. Let your head hang down, and make your tailbone point down to the floor so your lower back is round like the back of a cat. 3. Stay in this position for 5  seconds. 4. Slowly lift your head and make your tailbone point up to the ceiling so your back hangs low (sags) like the back of a cow. 5. Stay in this position for 5 seconds.  Press-Ups Do these steps 5-10 times in a row: 1. Lie on your belly (face-down) on the floor. 2. Place your hands near your head, about shoulder-width apart. 3. While you keep your back relaxed and keep your hips on the floor, slowly straighten your arms to raise the top half of your body and lift your shoulders. Do not use your back muscles. To make yourself more comfortable, you may change where you place your hands. 4. Stay in this position for 5 seconds. 5. Slowly return to lying flat on the floor.  Bridges Do these steps 10 times in a row: 1. Lie on your back on a firm surface. 2. Bend your knees so they point up to the ceiling. Your feet should be flat on the floor. 3. Tighten your butt muscles and lift your butt off of the floor until your waist is almost as high as your knees. If you do not feel the muscles working in your butt and the back of your thighs, slide your feet 1-2 inches farther away from your butt. 4. Stay in this position for 3-5 seconds. 5. Slowly  lower your butt to the floor, and let your butt muscles relax. If this exercise is too easy, try doing it with your arms crossed over your chest. Belly Crunches Do these steps 5-10 times in a row: 1. Lie on your back on a firm bed or the floor with your legs stretched out. 2. Bend your knees so they point up to the ceiling. Your feet should be flat on the floor. 3. Cross your arms over your chest. 4. Tip your chin a little bit toward your chest but do not bend your neck. 5. Tighten your belly muscles and slowly raise your chest just enough to lift your shoulder blades a tiny bit off of the floor. 6. Slowly lower your chest and your head to the floor. Back Lifts Do these steps 5-10 times in a row: 1. Lie on your belly (face-down) with your arms at  your sides, and rest your forehead on the floor. 2. Tighten the muscles in your legs and your butt. 3. Slowly lift your chest off of the floor while you keep your hips on the floor. Keep the back of your head in line with the curve in your back. Look at the floor while you do this. 4. Stay in this position for 3-5 seconds. 5. Slowly lower your chest and your face to the floor. Contact a doctor if:  Your back pain gets a lot worse when you do an exercise.  Your back pain does not lessen 2 hours after you exercise. If you have any of these problems, stop doing the exercises. Do not do them again unless your doctor says it is okay. Get help right away if:  You have sudden, very bad back pain. If this happens, stop doing the exercises. Do not do them again unless your doctor says it is okay. This information is not intended to replace advice given to you by your health care provider. Make sure you discuss any questions you have with your health care provider. Document Released: 12/21/2010 Document Revised: 08/12/2018 Document Reviewed: 01/12/2015 Elsevier Interactive Patient Education  [employer].

## 2019-02-05 NOTE — Progress Notes (Signed)
Subjective:    Patient ID: Derek [name], male    DOB: 26-Feb-1991, 26 y.o.   MRN: [id number]  HPI This is a 28 yo male who presents today following a snow boarding accident that occurred 6 days ago.  He fell while snowboarding and had pain the following day.  Pain was left of midline, low back.  He did not have any numbness tingling, radiation, weakness, bowel or bladder changes.  He was seen at an out-of-town urgent care and was given flexeril and prednisone. Significantly improved.  He has been out of work all week and needs a note releasing him to go back to work on Monday.  He states that he feels he is ready to resume his work at [employer].  He has 1 more pill of prednisone to take.  He is not sure of the dose.  He took the Flexeril twice but did not like the way it made him feel so he has not been taking it.  Past Medical History:  Diagnosis Date  . Fracture of scaphoid bone of wrist 01/2015   left navicular(scaphoid) fx.  . History of MRSA infection 2014   knee  . PONV (postoperative nausea and vomiting)    Past Surgical History:  Procedure Laterality Date  . ORIF SCAPHOID FRACTURE Left 03/02/2015   Procedure: LEFT WRIST OPEN REDUCTION INTERNAL FIXATION (ORIF) SCAPHOID ;  Surgeon: Mckinley Jewel, MD;  Location: [facility];  Service: Orthopedics;  Laterality: Left;  ANESTHESIA: GENERAL, BLOCK  . TONSILLECTOMY     No family history on file. Social History   Tobacco Use  . Smoking status: Former Smoker    Years: 3.00    Types: Cigarettes  . Smokeless tobacco: Never Used  . Tobacco comment: 1-2 cig./day  Substance Use Topics  . Alcohol use: Yes    Comment: once/week  . Drug use: No      Review of Systems Per HPI    Objective:   Physical Exam Vitals signs reviewed.  Constitutional:      General: He is not in acute distress.    Appearance: Normal appearance. He is normal weight. He is not ill-appearing, toxic-appearing or diaphoretic.  Eyes:   Conjunctiva/sclera: Conjunctivae normal.  Cardiovascular:     Rate and Rhythm: Normal rate.  Pulmonary:     Effort: Pulmonary effort is normal.  Musculoskeletal:     Lumbar back: He exhibits tenderness (left paraspinal). He exhibits normal range of motion, no bony tenderness and no swelling.     Comments: Gait normal.  Upper extremity strength/lower extremity/strength 5 out of 5.  Skin:    General: Skin is warm and dry.  Neurological:     Mental Status: He is alert and oriented to person, place, and time.  Psychiatric:        Mood and Affect: Mood normal.        Behavior: Behavior normal.        Thought Content: Thought content normal.        Judgment: Judgment normal.       BP 112/64 (BP Location: Left Arm, Patient Position: Sitting, Cuff Size: Large)   Pulse 94   Temp 98.1 F (36.7 C) (Oral)   Ht 6\' 5"  (1.956 m)   Wt 222 lb 12.8 oz (101.1 kg)   SpO2 97%   BMI 26.42 kg/m  Wt Readings from Last 3 Encounters:  02/05/19 222 lb 12.8 oz (101.1 kg)  05/27/18 211 lb 8 oz (95.9 kg)  05/22/18 216 lb 8 oz (98.2 kg)       Assessment & Plan:  1. Strain of lumbar region, initial encounter - Provided written and verbal information regarding diagnosis and treatment. - RTC/ER precautions reviewed - can take ibuprofen once finished with prednisone, reviewed recommended amounts - provided written instructions for exercises -Return to work paperwork completed   [name], FNP-BC  [facility] [facility], [facility]  02/05/2019 5:16 PM
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
## 2019-06-27 ENCOUNTER — Emergency Department (HOSPITAL_COMMUNITY): Payer: 59

## 2019-06-27 ENCOUNTER — Inpatient Hospital Stay (HOSPITAL_COMMUNITY)
Admission: EM | Admit: 2019-06-27 | Discharge: 2019-07-01 | DRG: 982 | Disposition: A | Discharge status: Home / Self Care | Payer: 59 | Attending: Student | Admitting: Student

## 2019-06-27 ENCOUNTER — Other Ambulatory Visit: Payer: Self-pay

## 2019-06-27 ENCOUNTER — Encounter (HOSPITAL_COMMUNITY): Payer: Self-pay | Admitting: *Deleted

## 2019-06-27 DIAGNOSIS — S42031A Displaced fracture of lateral end of right clavicle, initial encounter for closed fracture: Secondary | ICD-10-CM | POA: Diagnosis present

## 2019-06-27 DIAGNOSIS — S50811A Abrasion of right forearm, initial encounter: Secondary | ICD-10-CM | POA: Diagnosis present

## 2019-06-27 DIAGNOSIS — Z419 Encounter for procedure for purposes other than remedying health state, unspecified: Secondary | ICD-10-CM

## 2019-06-27 DIAGNOSIS — S2249XA Multiple fractures of ribs, unspecified side, initial encounter for closed fracture: Secondary | ICD-10-CM | POA: Diagnosis present

## 2019-06-27 DIAGNOSIS — Z20828 Contact with and (suspected) exposure to other viral communicable diseases: Secondary | ICD-10-CM | POA: Diagnosis present

## 2019-06-27 DIAGNOSIS — S42021A Displaced fracture of shaft of right clavicle, initial encounter for closed fracture: Secondary | ICD-10-CM

## 2019-06-27 DIAGNOSIS — S025XXA Fracture of tooth (traumatic), initial encounter for closed fracture: Secondary | ICD-10-CM

## 2019-06-27 DIAGNOSIS — T1490XA Injury, unspecified, initial encounter: Secondary | ICD-10-CM

## 2019-06-27 DIAGNOSIS — S2239XA Fracture of one rib, unspecified side, initial encounter for closed fracture: Secondary | ICD-10-CM | POA: Diagnosis not present

## 2019-06-27 DIAGNOSIS — Y92410 Unspecified street and highway as the place of occurrence of the external cause: Secondary | ICD-10-CM

## 2019-06-27 DIAGNOSIS — T148XXA Other injury of unspecified body region, initial encounter: Secondary | ICD-10-CM

## 2019-06-27 DIAGNOSIS — S2241XA Multiple fractures of ribs, right side, initial encounter for closed fracture: Principal | ICD-10-CM | POA: Diagnosis present

## 2019-06-27 DIAGNOSIS — S27321A Contusion of lung, unilateral, initial encounter: Secondary | ICD-10-CM | POA: Diagnosis present

## 2019-06-27 DIAGNOSIS — D62 Acute posthemorrhagic anemia: Secondary | ICD-10-CM | POA: Diagnosis not present

## 2019-06-27 DIAGNOSIS — T07XXXA Unspecified multiple injuries, initial encounter: Secondary | ICD-10-CM

## 2019-06-27 DIAGNOSIS — S40211A Abrasion of right shoulder, initial encounter: Secondary | ICD-10-CM | POA: Diagnosis present

## 2019-06-27 LAB — URINALYSIS, ROUTINE W REFLEX MICROSCOPIC
Bilirubin Urine: NEGATIVE
Glucose, UA: NEGATIVE mg/dL
Hgb urine dipstick: NEGATIVE
Ketones, ur: NEGATIVE mg/dL
Leukocytes,Ua: NEGATIVE
Nitrite: NEGATIVE
Protein, ur: NEGATIVE mg/dL
Specific Gravity, Urine: 1.031 — ABNORMAL HIGH (ref 1.005–1.030)
pH: 6 (ref 5.0–8.0)

## 2019-06-27 LAB — CBC
HCT: 44.8 % (ref 39.0–52.0)
Hemoglobin: 15.5 g/dL (ref 13.0–17.0)
MCH: 31.6 pg (ref 26.0–34.0)
MCHC: 34.6 g/dL (ref 30.0–36.0)
MCV: 91.4 fL (ref 80.0–100.0)
Platelets: 226 10*3/uL (ref 150–400)
RBC: 4.9 MIL/uL (ref 4.22–5.81)
RDW: 11.9 % (ref 11.5–15.5)
WBC: 14 10*3/uL — ABNORMAL HIGH (ref 4.0–10.5)
nRBC: 0 % (ref 0.0–0.2)

## 2019-06-27 LAB — SARS CORONAVIRUS 2 BY RT PCR (HOSPITAL ORDER, PERFORMED IN ~~LOC~~ HOSPITAL LAB): SARS Coronavirus 2: NEGATIVE

## 2019-06-27 LAB — COMPREHENSIVE METABOLIC PANEL
ALT: 21 U/L (ref 0–44)
AST: 46 U/L — ABNORMAL HIGH (ref 15–41)
Albumin: 4.3 g/dL (ref 3.5–5.0)
Alkaline Phosphatase: 49 U/L (ref 38–126)
Anion gap: 12 (ref 5–15)
BUN: 8 mg/dL (ref 6–20)
CO2: 20 mmol/L — ABNORMAL LOW (ref 22–32)
Calcium: 8.7 mg/dL — ABNORMAL LOW (ref 8.9–10.3)
Chloride: 108 mmol/L (ref 98–111)
Creatinine, Ser: 0.97 mg/dL (ref 0.61–1.24)
GFR calc Af Amer: >60 mL/min (ref >60)
GFR calc non Af Amer: >60 mL/min (ref >60)
Glucose, Bld: 107 mg/dL — ABNORMAL HIGH (ref 70–99)
Potassium: 4.2 mmol/L (ref 3.5–5.1)
Sodium: 140 mmol/L (ref 135–145)
Total Bilirubin: 1.3 mg/dL — ABNORMAL HIGH (ref 0.3–1.2)
Total Protein: 6.9 g/dL (ref 6.5–8.1)

## 2019-06-27 MED ORDER — ONDANSETRON 8 MG PO TBDP: 8.0000 mg | ORAL_TABLET | Freq: Three times a day (TID) | ORAL | 0 refills | Status: DC | PRN

## 2019-06-27 MED ORDER — HYDROMORPHONE HCL 1 MG/ML IJ SOLN
0.5000 mg | Freq: Once | INTRAMUSCULAR | Status: AC
  Administered 2019-06-27: 0.5 mg via INTRAVENOUS

## 2019-06-27 MED ORDER — BISACODYL 10 MG RE SUPP: 10.0000 mg | Freq: Every day | RECTAL | Status: DC | PRN

## 2019-06-27 MED ORDER — METHOCARBAMOL 500 MG PO TABS: 500.0000 mg | ORAL_TABLET | Freq: Four times a day (QID) | ORAL | Status: DC | PRN

## 2019-06-27 MED ORDER — CHLORHEXIDINE GLUCONATE 0.12 % MT SOLN: 15.0000 mL | Freq: Two times a day (BID) | OROMUCOSAL | 0 refills | Status: DC

## 2019-06-27 MED ORDER — OXYCODONE-ACETAMINOPHEN 5-325 MG PO TABS
1.0000 | ORAL_TABLET | Freq: Once | ORAL | Status: AC
  Administered 2019-06-27: 1 via ORAL
  Filled 2019-06-27: qty 1

## 2019-06-27 MED ORDER — METOPROLOL TARTRATE 5 MG/5ML IV SOLN: 5.0000 mg | Freq: Four times a day (QID) | INTRAVENOUS | Status: DC | PRN

## 2019-06-27 MED ORDER — ONDANSETRON 4 MG PO TBDP: 4.0000 mg | ORAL_TABLET | Freq: Four times a day (QID) | ORAL | Status: DC | PRN

## 2019-06-27 MED ORDER — HYDROMORPHONE HCL 1 MG/ML IJ SOLN
0.5000 mg | Freq: Once | INTRAMUSCULAR | Status: DC
  Filled 2019-06-27: qty 1

## 2019-06-27 MED ORDER — HYDROMORPHONE HCL 1 MG/ML IJ SOLN
0.5000 mg | INTRAMUSCULAR | Status: DC | PRN
  Administered 2019-06-27 (×2): 0.5 mg via INTRAVENOUS
  Filled 2019-06-27 (×2): qty 1

## 2019-06-27 MED ORDER — OXYCODONE-ACETAMINOPHEN 5-325 MG PO TABS: 1.0000 | ORAL_TABLET | Freq: Three times a day (TID) | ORAL | 0 refills | Status: DC | PRN

## 2019-06-27 MED ORDER — ONDANSETRON HCL 4 MG/2ML IJ SOLN
4.0000 mg | Freq: Four times a day (QID) | INTRAMUSCULAR | Status: DC | PRN
  Administered 2019-06-28: 20:00:00 4 mg via INTRAVENOUS
  Filled 2019-06-27 (×2): qty 2

## 2019-06-27 MED ORDER — ENOXAPARIN SODIUM 40 MG/0.4ML ~~LOC~~ SOLN: 40.0000 mg | SUBCUTANEOUS | Status: DC

## 2019-06-27 MED ORDER — OXYCODONE HCL 5 MG PO TABS
5.0000 mg | ORAL_TABLET | ORAL | Status: DC | PRN
  Administered 2019-06-27 – 2019-06-28 (×3): 5 mg via ORAL
  Filled 2019-06-27 (×2): qty 1

## 2019-06-27 MED ORDER — IOHEXOL 300 MG/ML  SOLN
100.0000 mL | Freq: Once | INTRAMUSCULAR | Status: AC | PRN
  Administered 2019-06-27: 90 mL via INTRAVENOUS

## 2019-06-27 MED ORDER — DOCUSATE SODIUM 100 MG PO CAPS
100.0000 mg | ORAL_CAPSULE | Freq: Two times a day (BID) | ORAL | Status: DC
  Administered 2019-06-27 – 2019-07-01 (×9): 100 mg via ORAL
  Filled 2019-06-27 (×9): qty 1

## 2019-06-27 MED ORDER — CEFAZOLIN SODIUM-DEXTROSE 2-4 GM/100ML-% IV SOLN
2.0000 g | INTRAVENOUS | Status: AC
  Administered 2019-06-28: 2 g via INTRAVENOUS

## 2019-06-27 MED ORDER — ENOXAPARIN SODIUM 40 MG/0.4ML ~~LOC~~ SOLN
40.0000 mg | SUBCUTANEOUS | Status: DC
  Administered 2019-06-28 – 2019-07-01 (×4): 40 mg via SUBCUTANEOUS
  Filled 2019-06-27 (×4): qty 0.4

## 2019-06-27 MED ORDER — HYDROMORPHONE HCL 1 MG/ML IJ SOLN: 0.5000 mg | Freq: Once | INTRAMUSCULAR | Status: DC

## 2019-06-27 MED ORDER — GABAPENTIN 300 MG PO CAPS
300.0000 mg | ORAL_CAPSULE | Freq: Three times a day (TID) | ORAL | Status: DC
  Administered 2019-06-27 – 2019-07-01 (×11): 300 mg via ORAL
  Filled 2019-06-27 (×11): qty 1

## 2019-06-27 MED ORDER — HYDRALAZINE HCL 20 MG/ML IJ SOLN: 10.0000 mg | INTRAMUSCULAR | Status: DC | PRN

## 2019-06-27 MED ORDER — IBUPROFEN 200 MG PO TABS
800.0000 mg | ORAL_TABLET | Freq: Three times a day (TID) | ORAL | Status: DC
  Administered 2019-06-27: 22:00:00 800 mg via ORAL
  Filled 2019-06-27: qty 4

## 2019-06-27 MED ORDER — SODIUM CHLORIDE 0.9 % IV SOLN
INTRAVENOUS | Status: DC
  Administered 2019-06-27 – 2019-06-28 (×3): via INTRAVENOUS

## 2019-06-27 MED ORDER — BACITRACIN ZINC 500 UNIT/GM EX OINT
TOPICAL_OINTMENT | Freq: Two times a day (BID) | CUTANEOUS | Status: DC
  Administered 2019-06-27: 3 via TOPICAL
  Administered 2019-06-27 – 2019-06-30 (×5): via TOPICAL
  Filled 2019-06-27: qty 28.4
  Filled 2019-06-27: qty 2.7
  Filled 2019-06-27 (×2): qty 28.4

## 2019-06-27 MED ORDER — SODIUM CHLORIDE 0.9 % IV BOLUS
1000.0000 mL | Freq: Once | INTRAVENOUS | Status: AC
  Administered 2019-06-27: 1000 mL via INTRAVENOUS

## 2019-06-27 MED ORDER — HYDROMORPHONE HCL 1 MG/ML IJ SOLN
1.0000 mg | Freq: Once | INTRAMUSCULAR | Status: AC
  Administered 2019-06-27: 06:00:00 1 mg via INTRAVENOUS
  Filled 2019-06-27: qty 1

## 2019-06-27 MED ORDER — HYDROMORPHONE HCL 1 MG/ML IJ SOLN
1.0000 mg | INTRAMUSCULAR | Status: DC | PRN
  Administered 2019-06-27 – 2019-06-28 (×3): 1 mg via INTRAVENOUS
  Filled 2019-06-27 (×3): qty 1

## 2019-06-27 MED ORDER — ONDANSETRON 4 MG PO TBDP
4.0000 mg | ORAL_TABLET | Freq: Once | ORAL | Status: AC
  Administered 2019-06-27: 4 mg via ORAL
  Filled 2019-06-27: qty 1

## 2019-06-27 MED ORDER — SODIUM CHLORIDE 0.9 % IV BOLUS
1000.0000 mL | Freq: Once | INTRAVENOUS | Status: AC
  Administered 2019-06-27: 12:00:00 1000 mL via INTRAVENOUS

## 2019-06-27 MED ORDER — ACETAMINOPHEN 325 MG PO TABS
650.0000 mg | ORAL_TABLET | Freq: Four times a day (QID) | ORAL | Status: DC
  Administered 2019-06-27 (×2): 650 mg via ORAL
  Filled 2019-06-27 (×2): qty 2

## 2019-06-27 MED ORDER — HYDROMORPHONE HCL 1 MG/ML IJ SOLN
0.5000 mg | Freq: Once | INTRAMUSCULAR | Status: AC
  Administered 2019-06-27: 0.5 mg via INTRAVENOUS
  Filled 2019-06-27: qty 1

## 2019-06-27 MED ORDER — ONDANSETRON HCL 4 MG/2ML IJ SOLN: 4.0000 mg | Freq: Once | INTRAMUSCULAR | Status: DC

## 2019-06-27 MED ORDER — ACETAMINOPHEN 500 MG PO TABS: 1000.0000 mg | ORAL_TABLET | Freq: Three times a day (TID) | ORAL | 0 refills | Status: DC

## 2019-06-27 NOTE — ED Notes (Signed)
Pt stood to use urinal and began c/o R mid-back pain.  Dr Ashok Cordia notified.
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
## 2019-06-27 NOTE — ED Notes (Signed)
[place] pts father call with any information/updates
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
## 2019-06-27 NOTE — ED Notes (Signed)
Wounds cleaned and bandaged with gauze and kerlix
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
## 2019-06-27 NOTE — ED Notes (Signed)
Warm blankets  
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
## 2019-06-27 NOTE — Discharge Instructions (Addendum)
Apply Bacitracin to all road rash twice a day and cover with gauze and tape   Rib Fracture  A rib fracture is a break or crack in one of the bones of the ribs. The ribs are like a cage that goes around your upper chest. A broken or cracked rib is often painful, but most do not cause other problems. Most rib fractures usually heal on their own in 1-3 months. Follow these instructions at home: Managing pain, stiffness, and swelling  If directed, apply ice to the injured area. ? Put ice in a plastic bag. ? Place a towel between your skin and the bag. ? Leave the ice on for 20 minutes, 2-3 times a day.  Take over-the-counter and prescription medicines only as told by your doctor. Activity  Avoid activities that cause pain to the injured area. Protect your injured area.  Slowly increase activity as told by your doctor. General instructions  Do deep breathing as told by your doctor. You may be told to: ? Take deep breaths many times a day. ? Cough many times a day while hugging a pillow. ? Use a device (incentive spirometer) to do deep breathing many times a day.  Drink enough fluid to keep your pee (urine) clear or pale yellow.  Do not wear a rib belt or binder. These do not allow you to breathe deeply.  Keep all follow-up visits as told by your doctor. This is important. Contact a doctor if:  You have a fever. Get help right away if:  You have trouble breathing.  You are short of breath.  You cannot stop coughing.  You cough up thick or bloody spit (sputum).  You feel sick to your stomach (nauseous), throw up (vomit), or have belly (abdominal) pain.  Your pain gets worse and medicine does not help. Summary  A rib fracture is a break or crack in one of the bones of the ribs.  Apply ice to the injured area and take medicines for pain as told by your doctor.  Take deep breaths and cough many times a day. Hug a pillow every time you cough. This information is not  intended to replace advice given to you by your health care provider. Make sure you discuss any questions you have with your health care provider. Document Released: 08/27/2008 Document Revised: 10/31/2017 Document Reviewed: 02/18/2017 Elsevier Patient Education  2020 [place] [facility] Discharge Instructions   General Discharge Instructions  WEIGHT BEARING STATUS: non-weightbearing right upper extremity  RANGE OF MOTION/ACTIVITY: Okay for gentle range of motion of right shoulder. Wear sling for comfort  Wound Care: Incisions can be left open to air if there is no drainage. Leave steri-strips in place. If incision continues to have drainage, follow wound care instructions below. Okay to shower and get steri-strips in place  DVT/PE prophylaxis: None  Diet: as you were eating previously.  Can use over the counter stool softeners and bowel preparations, such as Miralax, to help with bowel movements.  Narcotics can be constipating.  Be sure to drink plenty of fluids  PAIN MEDICATION USE AND EXPECTATIONS  You have likely been given narcotic medications to help control your pain.  After a traumatic event that results in an fracture (broken bone) with or without surgery, it is ok to use narcotic pain medications to help control one's pain.  We understand that everyone responds to pain differently and each individual patient will be evaluated on a regular basis for  the continued need for narcotic medications. Ideally, narcotic medication use should last no more than 6-8 weeks (coinciding with fracture healing).   As a patient it is your responsibility as well to monitor narcotic medication use and report the amount and frequency you use these medications when you come to your office visit.   We would also advise that if you are using narcotic medications, you should take a dose prior to therapy to maximize you participation.  IF YOU ARE ON NARCOTIC MEDICATIONS IT IS NOT  PERMISSIBLE TO OPERATE A MOTOR VEHICLE (MOTORCYCLE/CAR/TRUCK/MOPED) OR HEAVY MACHINERY DO NOT MIX NARCOTICS WITH OTHER CNS (CENTRAL NERVOUS SYSTEM) DEPRESSANTS SUCH AS ALCOHOL   STOP SMOKING OR USING NICOTINE PRODUCTS!!!!  As discussed nicotine severely impairs your body's ability to heal surgical and traumatic wounds but also impairs bone healing.  Wounds and bone heal by forming microscopic blood vessels (angiogenesis) and nicotine is a vasoconstrictor (essentially, shrinks blood vessels).  Therefore, if vasoconstriction occurs to these microscopic blood vessels they essentially disappear and are unable to deliver necessary nutrients to the healing tissue.  This is one modifiable factor that you can do to dramatically increase your chances of healing your injury.    (This means no smoking, no nicotine gum, patches, etc)  DO NOT USE NONSTEROIDAL ANTI-INFLAMMATORY DRUGS (NSAID'S)  Using products such as Advil (ibuprofen), Aleve (naproxen), Motrin (ibuprofen) for additional pain control during fracture healing can delay and/or prevent the healing response.  If you would like to take over the counter (OTC) medication, Tylenol (acetaminophen) is ok.  However, some narcotic medications that are given for pain control contain acetaminophen as well. Therefore, you should not exceed more than 4000 mg of tylenol in a day if you do not have liver disease.  Also note that there are may OTC medicines, such as cold medicines and allergy medicines that my contain tylenol as well.  If you have any questions about medications and/or interactions please ask your doctor/PA or your pharmacist.      ICE AND ELEVATE INJURED/OPERATIVE EXTREMITY  Using ice and elevating the injured extremity above your heart can help with swelling and pain control.  Icing in a pulsatile fashion, such as 20 minutes on and 20 minutes off, can be followed.    Do not place ice directly on skin. Make sure there is a barrier between to skin and  the ice pack.    Using frozen items such as frozen peas works well as the conform nicely to the are that needs to be iced.  USE AN ACE WRAP OR TED HOSE FOR SWELLING CONTROL  In addition to icing and elevation, Ace wraps or TED hose are used to help limit and resolve swelling.  It is recommended to use Ace wraps or TED hose until you are informed to stop.    When using Ace Wraps start the wrapping distally (farthest away from the body) and wrap proximally (closer to the body)   Example: If you had surgery on your leg or thing and you do not have a splint on, start the ace wrap at the toes and work your way up to the thigh        If you had surgery on your upper extremity and do not have a splint on, start the ace wrap at your fingers and work your way up to the upper arm    CALL THE OFFICE WITH ANY QUESTIONS OR CONCERNS: [phone]   VISIT OUR WEBSITE FOR ADDITIONAL INFORMATION: [email or web address]  Discharge Wound Care Instructions  Do NOT apply any ointments, solutions or lotions to pin sites or surgical wounds.  These prevent needed drainage and even though solutions like hydrogen peroxide kill bacteria, they also damage cells lining the pin sites that help fight infection.  Applying lotions or ointments can keep the wounds moist and can cause them to breakdown and open up as well. This can increase the risk for infection. When in doubt call the office.  Surgical incisions should be dressed daily.  If any drainage is noted, use one layer of adaptic, then gauze, Kerlix, and an ace wrap.  Once the incision is completely dry and without drainage, it may be left open to air out.  Showering may begin 36-48 hours later.  Cleaning gently with soap and water.  Traumatic wounds should be dressed daily as well.    One layer of adaptic, gauze, Kerlix, then ace wrap.  The adaptic can be discontinued once the draining has ceased    If you have a wet to dry dressing: wet the gauze with saline  the squeeze as much saline out so the gauze is moist (not soaking wet), place moistened gauze over wound, then place a dry gauze over the moist one, followed by Kerlix wrap, then ace wrap.
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
## 2019-06-27 NOTE — ED Notes (Signed)
Portable chest pelvis and rt shou;der completed
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
## 2019-06-27 NOTE — ED Notes (Signed)
Assisted pt to wheelchair.  In doing so pt began dry heaving, became pale and diaphoretic.  BP 106/65, hr 65.  MD notified.

## 2019-06-27 NOTE — Anesthesia Preprocedure Evaluation (Addendum)
Anesthesia Evaluation  Patient identified by MRN, date of birth, ID band Patient awake    Reviewed: Allergy & Precautions, NPO status , Patient's Chart, lab work & pertinent test results  Airway Mallampati: II  TM Distance: >3 FB     Dental  (+) Dental Advisory Given   Pulmonary neg pulmonary ROS,    breath sounds clear to auscultation       Cardiovascular negative cardio ROS   Rhythm:Regular Rate:Normal     Neuro/Psych negative neurological ROS     GI/Hepatic negative GI ROS, Neg liver ROS,   Endo/Other  negative endocrine ROS  Renal/GU negative Renal ROS     Musculoskeletal   Abdominal   Peds  Hematology negative hematology ROS (+)   Anesthesia Other Findings   Reproductive/Obstetrics                            Lab Results  Component Value Date   WBC 14.0 (H) 06/27/2019   HGB 15.5 06/27/2019   HCT 44.8 06/27/2019   MCV 91.4 06/27/2019   PLT 226 06/27/2019   Lab Results  Component Value Date   CREATININE 0.97 06/27/2019   BUN 8 06/27/2019   NA 140 06/27/2019   K 4.2 06/27/2019   CL 108 06/27/2019   CO2 20 (L) 06/27/2019    Anesthesia Physical Anesthesia Plan  ASA: II  Anesthesia Plan: General   Post-op Pain Management:  Regional for Post-op pain   Induction: Intravenous  PONV Risk Score and Plan: 2 and Dexamethasone, Ondansetron and Treatment may vary due to age or medical condition  Airway Management Planned: Oral ETT  Additional Equipment:   Intra-op Plan:   Post-operative Plan: Extubation in OR  Informed Consent: I have reviewed the patients History and Physical, chart, labs and discussed the procedure including the risks, benefits and alternatives for the proposed anesthesia with the patient or authorized representative who has indicated his/her understanding and acceptance.     Dental advisory given  Plan Discussed with: CRNA  Anesthesia Plan  Comments:        Anesthesia Quick Evaluation

## 2019-06-27 NOTE — Progress Notes (Signed)
Orthopedic Tech Progress Note Patient Details:  Derek [name] [birth year] [id number]  Ortho Devices Type of Ortho Device: Sling immobilizer Ortho Device/Splint Location: right Ortho Device/Splint Interventions: Application   Post Interventions Patient Tolerated: Well Instructions Provided: Care of device   [name] 06/27/2019, 7:54 AM
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
## 2019-06-27 NOTE — ED Notes (Signed)
bp  120/h 84

## 2019-06-28 ENCOUNTER — Observation Stay (HOSPITAL_COMMUNITY): Payer: 59 | Admitting: Anesthesiology

## 2019-06-28 ENCOUNTER — Observation Stay (HOSPITAL_COMMUNITY): Payer: 59

## 2019-06-28 ENCOUNTER — Encounter (HOSPITAL_COMMUNITY): Admission: EM | Disposition: A | Discharge status: Home / Self Care | Payer: Self-pay | Source: Home / Self Care

## 2019-06-28 DIAGNOSIS — S025XXA Fracture of tooth (traumatic), initial encounter for closed fracture: Secondary | ICD-10-CM | POA: Diagnosis present

## 2019-06-28 DIAGNOSIS — S2239XA Fracture of one rib, unspecified side, initial encounter for closed fracture: Secondary | ICD-10-CM | POA: Diagnosis present

## 2019-06-28 DIAGNOSIS — S27321A Contusion of lung, unilateral, initial encounter: Secondary | ICD-10-CM | POA: Diagnosis present

## 2019-06-28 DIAGNOSIS — D62 Acute posthemorrhagic anemia: Secondary | ICD-10-CM | POA: Diagnosis not present

## 2019-06-28 DIAGNOSIS — Z20828 Contact with and (suspected) exposure to other viral communicable diseases: Secondary | ICD-10-CM | POA: Diagnosis present

## 2019-06-28 DIAGNOSIS — S42021A Displaced fracture of shaft of right clavicle, initial encounter for closed fracture: Secondary | ICD-10-CM

## 2019-06-28 DIAGNOSIS — Y92410 Unspecified street and highway as the place of occurrence of the external cause: Secondary | ICD-10-CM | POA: Diagnosis not present

## 2019-06-28 DIAGNOSIS — S42031A Displaced fracture of lateral end of right clavicle, initial encounter for closed fracture: Secondary | ICD-10-CM | POA: Diagnosis present

## 2019-06-28 DIAGNOSIS — S2241XA Multiple fractures of ribs, right side, initial encounter for closed fracture: Secondary | ICD-10-CM | POA: Diagnosis present

## 2019-06-28 DIAGNOSIS — S40211A Abrasion of right shoulder, initial encounter: Secondary | ICD-10-CM | POA: Diagnosis present

## 2019-06-28 DIAGNOSIS — S50811A Abrasion of right forearm, initial encounter: Secondary | ICD-10-CM | POA: Diagnosis present

## 2019-06-28 HISTORY — PX: ORIF CLAVICULAR FRACTURE: SHX5055

## 2019-06-28 LAB — BASIC METABOLIC PANEL
Anion gap: 8 (ref 5–15)
BUN: 8 mg/dL (ref 6–20)
CO2: 23 mmol/L (ref 22–32)
Calcium: 8.5 mg/dL — ABNORMAL LOW (ref 8.9–10.3)
Chloride: 106 mmol/L (ref 98–111)
Creatinine, Ser: 1 mg/dL (ref 0.61–1.24)
GFR calc Af Amer: >60 mL/min (ref >60)
GFR calc non Af Amer: >60 mL/min (ref >60)
Glucose, Bld: 106 mg/dL — ABNORMAL HIGH (ref 70–99)
Potassium: 4.1 mmol/L (ref 3.5–5.1)
Sodium: 137 mmol/L (ref 135–145)

## 2019-06-28 LAB — CBC
HCT: 41.6 % (ref 39.0–52.0)
Hemoglobin: 14.3 g/dL (ref 13.0–17.0)
MCH: 31.6 pg (ref 26.0–34.0)
MCHC: 34.4 g/dL (ref 30.0–36.0)
MCV: 91.8 fL (ref 80.0–100.0)
Platelets: 171 10*3/uL (ref 150–400)
RBC: 4.53 MIL/uL (ref 4.22–5.81)
RDW: 11.7 % (ref 11.5–15.5)
WBC: 7.9 10*3/uL (ref 4.0–10.5)
nRBC: 0 % (ref 0.0–0.2)

## 2019-06-28 LAB — HIV ANTIBODY (ROUTINE TESTING W REFLEX): HIV Screen 4th Generation wRfx: NONREACTIVE

## 2019-06-28 SURGERY — OPEN REDUCTION INTERNAL FIXATION (ORIF) CLAVICULAR FRACTURE
Anesthesia: General | Laterality: Right

## 2019-06-28 MED ORDER — FENTANYL CITRATE (PF) 250 MCG/5ML IJ SOLN
INTRAMUSCULAR | Status: DC | PRN
  Administered 2019-06-28: 50 ug via INTRAVENOUS
  Administered 2019-06-28: 25 ug via INTRAVENOUS
  Administered 2019-06-28: 50 ug via INTRAVENOUS
  Administered 2019-06-28: 100 ug via INTRAVENOUS
  Administered 2019-06-28: 25 ug via INTRAVENOUS

## 2019-06-28 MED ORDER — CEFAZOLIN SODIUM-DEXTROSE 2-4 GM/100ML-% IV SOLN
2.0000 g | Freq: Three times a day (TID) | INTRAVENOUS | Status: AC
  Administered 2019-06-28 – 2019-06-29 (×3): 2 g via INTRAVENOUS
  Filled 2019-06-28 (×3): qty 100

## 2019-06-28 MED ORDER — LIDOCAINE 2% (20 MG/ML) 5 ML SYRINGE
INTRAMUSCULAR | Status: DC | PRN
  Administered 2019-06-28: 40 mg via INTRAVENOUS

## 2019-06-28 MED ORDER — ROCURONIUM BROMIDE 10 MG/ML (PF) SYRINGE
PREFILLED_SYRINGE | INTRAVENOUS | Status: DC | PRN
  Administered 2019-06-28 (×2): 10 mg via INTRAVENOUS
  Administered 2019-06-28: 50 mg via INTRAVENOUS

## 2019-06-28 MED ORDER — VANCOMYCIN HCL 1000 MG IV SOLR
INTRAVENOUS | Status: DC | PRN
  Administered 2019-06-28: 1000 mg via TOPICAL

## 2019-06-28 MED ORDER — SUCCINYLCHOLINE CHLORIDE 200 MG/10ML IV SOSY
PREFILLED_SYRINGE | INTRAVENOUS | Status: DC | PRN
  Administered 2019-06-28: 120 mg via INTRAVENOUS

## 2019-06-28 MED ORDER — FENTANYL CITRATE (PF) 100 MCG/2ML IJ SOLN
INTRAMUSCULAR | Status: AC
  Filled 2019-06-28: qty 2

## 2019-06-28 MED ORDER — VANCOMYCIN HCL 1000 MG IV SOLR
INTRAVENOUS | Status: AC
  Filled 2019-06-28: qty 1000

## 2019-06-28 MED ORDER — DEXAMETHASONE SODIUM PHOSPHATE 10 MG/ML IJ SOLN
INTRAMUSCULAR | Status: DC | PRN
  Administered 2019-06-28: 10 mg via INTRAVENOUS

## 2019-06-28 MED ORDER — FENTANYL CITRATE (PF) 250 MCG/5ML IJ SOLN
INTRAMUSCULAR | Status: AC
  Filled 2019-06-28: qty 5

## 2019-06-28 MED ORDER — PROPOFOL 10 MG/ML IV BOLUS
INTRAVENOUS | Status: DC | PRN
  Administered 2019-06-28 (×2): 50 mg via INTRAVENOUS
  Administered 2019-06-28: 200 mg via INTRAVENOUS

## 2019-06-28 MED ORDER — MIDAZOLAM HCL 5 MG/5ML IJ SOLN
INTRAMUSCULAR | Status: DC | PRN
  Administered 2019-06-28: 2 mg via INTRAVENOUS

## 2019-06-28 MED ORDER — ONDANSETRON HCL 4 MG/2ML IJ SOLN
INTRAMUSCULAR | Status: DC | PRN
  Administered 2019-06-28: 4 mg via INTRAVENOUS

## 2019-06-28 MED ORDER — MIDAZOLAM HCL 2 MG/2ML IJ SOLN
INTRAMUSCULAR | Status: AC
  Filled 2019-06-28: qty 2

## 2019-06-28 MED ORDER — BACITRACIN ZINC 500 UNIT/GM EX OINT
TOPICAL_OINTMENT | CUTANEOUS | Status: AC
  Filled 2019-06-28: qty 28.35

## 2019-06-28 MED ORDER — BACITRACIN 500 UNIT/GM EX OINT
TOPICAL_OINTMENT | CUTANEOUS | Status: DC | PRN
  Administered 2019-06-28: 1 via TOPICAL

## 2019-06-28 MED ORDER — METHOCARBAMOL 500 MG PO TABS
500.0000 mg | ORAL_TABLET | Freq: Four times a day (QID) | ORAL | Status: DC
  Administered 2019-06-28 – 2019-06-30 (×11): 500 mg via ORAL
  Filled 2019-06-28 (×11): qty 1

## 2019-06-28 MED ORDER — PROPOFOL 10 MG/ML IV BOLUS
INTRAVENOUS | Status: AC
  Filled 2019-06-28: qty 40

## 2019-06-28 MED ORDER — ONDANSETRON HCL 4 MG/2ML IJ SOLN: 4.0000 mg | Freq: Once | INTRAMUSCULAR | Status: DC | PRN

## 2019-06-28 MED ORDER — FENTANYL CITRATE (PF) 100 MCG/2ML IJ SOLN
25.0000 ug | INTRAMUSCULAR | Status: DC | PRN
  Administered 2019-06-28 (×2): 50 ug via INTRAVENOUS

## 2019-06-28 MED ORDER — SUGAMMADEX SODIUM 200 MG/2ML IV SOLN
INTRAVENOUS | Status: DC | PRN
  Administered 2019-06-28: 200 mg via INTRAVENOUS

## 2019-06-28 MED ORDER — 0.9 % SODIUM CHLORIDE (POUR BTL) OPTIME
TOPICAL | Status: DC | PRN
  Administered 2019-06-28: 07:00:00 1000 mL

## 2019-06-28 MED ORDER — BUPIVACAINE-EPINEPHRINE (PF) 0.5% -1:200000 IJ SOLN
INTRAMUSCULAR | Status: DC | PRN
  Administered 2019-06-28: 30 mL via PERINEURAL

## 2019-06-28 MED ORDER — ACETAMINOPHEN 500 MG PO TABS
1000.0000 mg | ORAL_TABLET | Freq: Four times a day (QID) | ORAL | Status: DC
  Administered 2019-06-28 – 2019-07-01 (×12): 1000 mg via ORAL
  Filled 2019-06-28 (×12): qty 2

## 2019-06-28 MED ORDER — DEXMEDETOMIDINE HCL IN NACL 200 MCG/50ML IV SOLN
INTRAVENOUS | Status: DC | PRN
  Administered 2019-06-28: 12 ug via INTRAVENOUS
  Administered 2019-06-28: 8 ug via INTRAVENOUS

## 2019-06-28 MED ORDER — OXYCODONE HCL 5 MG PO TABS
5.0000 mg | ORAL_TABLET | ORAL | Status: DC | PRN
  Administered 2019-06-28 (×3): 10 mg via ORAL
  Administered 2019-06-29: 5 mg via ORAL
  Administered 2019-06-29 – 2019-07-01 (×11): 10 mg via ORAL
  Filled 2019-06-28 (×15): qty 2

## 2019-06-28 MED ORDER — HYDROMORPHONE HCL 1 MG/ML IJ SOLN
1.0000 mg | INTRAMUSCULAR | Status: DC | PRN
  Administered 2019-06-28 – 2019-06-30 (×10): 1 mg via INTRAVENOUS
  Filled 2019-06-28 (×11): qty 1

## 2019-06-28 MED ORDER — LACTATED RINGERS IV SOLN
INTRAVENOUS | Status: DC | PRN
  Administered 2019-06-28 (×2): via INTRAVENOUS

## 2019-06-28 SURGICAL SUPPLY — 57 items
BIT DRILL 2.5X110 QC LCP DISP (BIT) ×1 IMPLANT
BNDG COHESIVE 4X5 TAN STRL (GAUZE/BANDAGES/DRESSINGS) IMPLANT
BRUSH SCRUB EZ PLAIN DRY (MISCELLANEOUS) ×4 IMPLANT
CHLORAPREP W/TINT 26 (MISCELLANEOUS) ×2 IMPLANT
CLSR STERI-STRIP ANTIMIC 1/2X4 (GAUZE/BANDAGES/DRESSINGS) ×1 IMPLANT
COVER SURGICAL LIGHT HANDLE (MISCELLANEOUS) ×4 IMPLANT
COVER WAND RF STERILE (DRAPES) ×2 IMPLANT
DERMABOND ADVANCED (GAUZE/BANDAGES/DRESSINGS) ×2
DERMABOND ADVANCED .7 DNX12 (GAUZE/BANDAGES/DRESSINGS) ×2 IMPLANT
DRAPE C-ARM 42X72 X-RAY (DRAPES) ×2 IMPLANT
DRAPE INCISE IOBAN 66X45 STRL (DRAPES) ×2 IMPLANT
DRAPE ORTHO SPLIT 77X108 STRL (DRAPES) ×2
DRAPE SURG ORHT 6 SPLT 77X108 (DRAPES) ×2 IMPLANT
DRAPE U-SHAPE 47X51 STRL (DRAPES) ×4 IMPLANT
DRSG MEPILEX BORDER 4X8 (GAUZE/BANDAGES/DRESSINGS) ×2 IMPLANT
DRSG TEGADERM 4X4.75 (GAUZE/BANDAGES/DRESSINGS) ×1 IMPLANT
ELECT REM PT RETURN 9FT ADLT (ELECTROSURGICAL) ×2
ELECTRODE REM PT RTRN 9FT ADLT (ELECTROSURGICAL) ×1 IMPLANT
GAUZE SPONGE 4X4 12PLY STRL LF (GAUZE/BANDAGES/DRESSINGS) ×1 IMPLANT
GLOVE BIO SURGEON STRL SZ 6.5 (GLOVE) ×6 IMPLANT
GLOVE BIO SURGEON STRL SZ7.5 (GLOVE) ×6 IMPLANT
GLOVE BIOGEL PI IND STRL 6.5 (GLOVE) ×1 IMPLANT
GLOVE BIOGEL PI IND STRL 7.5 (GLOVE) ×1 IMPLANT
GLOVE BIOGEL PI INDICATOR 6.5 (GLOVE) ×1
GLOVE BIOGEL PI INDICATOR 7.5 (GLOVE) ×1
GOWN STRL REUS W/ TWL LRG LVL3 (GOWN DISPOSABLE) ×2 IMPLANT
GOWN STRL REUS W/TWL LRG LVL3 (GOWN DISPOSABLE) ×2
KIT BASIN OR (CUSTOM PROCEDURE TRAY) ×2 IMPLANT
KIT TURNOVER KIT B (KITS) ×2 IMPLANT
MANIFOLD NEPTUNE II (INSTRUMENTS) ×2 IMPLANT
NDL HYPO 25GX1X1/2 BEV (NEEDLE) IMPLANT
NEEDLE HYPO 25GX1X1/2 BEV (NEEDLE) IMPLANT
NS IRRIG 1000ML POUR BTL (IV SOLUTION) ×2 IMPLANT
PACK GENERAL/GYN (CUSTOM PROCEDURE TRAY) ×2 IMPLANT
PAD ARMBOARD 7.5X6 YLW CONV (MISCELLANEOUS) ×4 IMPLANT
PLATE LCP 3.5 7H 98 (Plate) ×1 IMPLANT
SCREW CORTEX 3.5 18MM (Screw) ×6 IMPLANT
SCREW CORTEX 3.5 20MM (Screw) ×1 IMPLANT
SCREW CORTEX 3.5 22MM (Screw) ×1 IMPLANT
SCREW LOCK CORT ST 3.5X18 (Screw) IMPLANT
SCREW LOCK CORT ST 3.5X20 (Screw) IMPLANT
SCREW LOCK CORT ST 3.5X22 (Screw) IMPLANT
SLING ARM IMMOBILIZER LRG (SOFTGOODS) IMPLANT
SLING ARM IMMOBILIZER MED (SOFTGOODS) IMPLANT
STAPLER VISISTAT 35W (STAPLE) ×2 IMPLANT
STOCKINETTE IMPERVIOUS 9X36 MD (GAUZE/BANDAGES/DRESSINGS) IMPLANT
SUCTION FRAZIER HANDLE 10FR (MISCELLANEOUS) ×1
SUCTION TUBE FRAZIER 10FR DISP (MISCELLANEOUS) ×1 IMPLANT
SUT MNCRL AB 3-0 PS2 18 (SUTURE) ×2 IMPLANT
SUT MNCRL AB 3-0 PS2 27 (SUTURE) ×2 IMPLANT
SUT VIC AB 0 CT1 27 (SUTURE) ×1
SUT VIC AB 0 CT1 27XBRD ANBCTR (SUTURE) ×1 IMPLANT
SUT VIC AB 2-0 CT1 27 (SUTURE) ×1
SUT VIC AB 2-0 CT1 TAPERPNT 27 (SUTURE) ×1 IMPLANT
SYR CONTROL 10ML LL (SYRINGE) IMPLANT
TOWEL GREEN STERILE (TOWEL DISPOSABLE) ×2 IMPLANT
WATER STERILE IRR 1000ML POUR (IV SOLUTION) ×2 IMPLANT
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
## 2019-06-28 NOTE — Anesthesia Procedure Notes (Addendum)
Anesthesia Regional Block: Interscalene brachial plexus block   Pre-Anesthetic Checklist: ,, timeout performed, Correct Patient, Correct Site, Correct Laterality, Correct Procedure, Correct Position, site marked, Risks and benefits discussed,  Surgical consent,  Pre-op evaluation,  At surgeon's request and post-op pain management  Laterality: Right  Prep: chloraprep       Needles:  Injection technique: Single-shot  Needle Type: Echogenic Stimulator Needle     Needle Length: 9cm  Needle Gauge: 21     Additional Needles:   Procedures:, nerve stimulator,,, ultrasound used (permanent image in chart),,,,   Nerve Stimulator or Paresthesia:  Response: deltoid and biceps, 0.5 mA,   Additional Responses:   Narrative:  Start time: 06/28/2019 7:10 AM End time: 06/28/2019 7:16 AM Injection made incrementally with aspirations every 5 mL.  Performed by: Personally  Anesthesiologist: Suzette Battiest, MD

## 2019-06-28 NOTE — Anesthesia Procedure Notes (Signed)
Procedure Name: Intubation Date/Time: 06/28/2019 7:32 AM Performed by: Imagene Riches, CRNA Pre-anesthesia Checklist: Patient identified, Emergency Drugs available, Suction available and Patient being monitored Patient Re-evaluated:Patient Re-evaluated prior to induction Oxygen Delivery Method: Circle System Utilized Preoxygenation: Pre-oxygenation with 100% oxygen Induction Type: IV induction Laryngoscope Size: Miller and 2 Grade View: Grade I Tube type: Oral Tube size: 7.5 mm Number of attempts: 1 Airway Equipment and Method: Stylet and Oral airway Placement Confirmation: ETT inserted through vocal cords under direct vision,  positive ETCO2 and breath sounds checked- equal and bilateral Secured at: 23 cm Tube secured with: Tape Dental Injury: Teeth and Oropharynx as per pre-operative assessment

## 2019-06-28 NOTE — Anesthesia Postprocedure Evaluation (Signed)
Anesthesia Post Note  Patient: Derek [name]  Procedure(s) Performed: OPEN REDUCTION INTERNAL FIXATION (ORIF) CLAVICULAR FRACTURE (Right )     Patient location during evaluation: PACU Anesthesia Type: General Level of consciousness: awake and alert Pain management: pain level controlled Vital Signs Assessment: post-procedure vital signs reviewed and stable Respiratory status: spontaneous breathing, nonlabored ventilation, respiratory function stable and patient connected to nasal cannula oxygen Cardiovascular status: blood pressure returned to baseline and stable Postop Assessment: no apparent nausea or vomiting Anesthetic complications: no    Last Vitals:  Vitals:   06/28/19 1025 06/28/19 1038  BP: 104/65 112/77  Pulse: 76 76  Resp: 10 20  Temp: 36.8 C 36.9 C  SpO2: 92% 96%    Last Pain:  Vitals:   06/28/19 1038  TempSrc: Oral  PainSc: 7                  Tiajuana Amass

## 2019-06-28 NOTE — Transfer of Care (Signed)
Immediate Anesthesia Transfer of Care Note  Patient: Derek [name]  Procedure(s) Performed: OPEN REDUCTION INTERNAL FIXATION (ORIF) CLAVICULAR FRACTURE (Right )  Patient Location: PACU  Anesthesia Type:General  Level of Consciousness: awake, alert  and oriented  Airway & Oxygen Therapy: Patient Spontanous Breathing  Post-op Assessment: Report given to RN and Post -op Vital signs reviewed and stable  Post vital signs: Reviewed and stable  Last Vitals:  Vitals Value Taken Time  BP 138/93 06/28/19 0940  Temp    Pulse 102 06/28/19 0942  Resp 22 06/28/19 0942  SpO2 97 % 06/28/19 0942  Vitals shown include unvalidated device data.  Last Pain:  Vitals:   06/28/19 0505  TempSrc:   PainSc: Asleep      Patients Stated Pain Goal: 8 (54/98/26 4158)  Complications: No apparent anesthesia complications

## 2019-06-28 NOTE — Progress Notes (Signed)
OT Cancellation Note  Patient Details Name: Derek [name] MRN: [id number] DOB: [birth year]   Cancelled Treatment:    Reason Eval/Treat Not Completed: Patient at procedure or test/ unavailable.  Will reattempt  Lucille Passy, OTR/L [place] Pager [phone] Office [phone]   Lucille Passy M 06/28/2019, 6:39 AM

## 2019-06-28 NOTE — Interval H&P Note (Signed)
History and Physical Interval Note:  06/28/2019 7:16 AM  Derek [name]  has presented today for surgery, with the diagnosis of Right clavicle fracture.  The various methods of treatment have been discussed with the patient and family. After consideration of risks, benefits and other options for treatment, the patient has consented to  Procedure(s): OPEN REDUCTION INTERNAL FIXATION (ORIF) CLAVICULAR FRACTURE (Right) as a surgical intervention.  The patient's history has been reviewed, patient examined, no change in status, stable for surgery.  I have reviewed the patient's chart and labs.  Questions were answered to the patient's satisfaction.     Lennette Bihari P Deandra Gadson

## 2019-06-28 NOTE — Progress Notes (Signed)
PT Cancellation Note  Patient Details Name: Derek [name] MRN: [id number] DOB: Apr 23, 1991   Cancelled Treatment:    Reason Eval/Treat Not Completed: Patient at procedure or test/unavailable. Pt currently off unit in OR. Will continue to follow and initiate PT evaluation when pt is available and medically appropriate.    Thelma Comp 06/28/2019, 7:40 AM   Rolinda Roan, PT, DPT Acute Rehabilitation Services Pager: [phone] Office: [phone]
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
## 2019-06-29 ENCOUNTER — Encounter (HOSPITAL_COMMUNITY): Payer: Self-pay | Admitting: Student

## 2019-06-29 LAB — CBC
HCT: 36.8 % — ABNORMAL LOW (ref 39.0–52.0)
Hemoglobin: 12.8 g/dL — ABNORMAL LOW (ref 13.0–17.0)
MCH: 31.7 pg (ref 26.0–34.0)
MCHC: 34.8 g/dL (ref 30.0–36.0)
MCV: 91.1 fL (ref 80.0–100.0)
Platelets: 189 10*3/uL (ref 150–400)
RBC: 4.04 MIL/uL — ABNORMAL LOW (ref 4.22–5.81)
RDW: 11.4 % — ABNORMAL LOW (ref 11.5–15.5)
WBC: 7.8 10*3/uL (ref 4.0–10.5)
nRBC: 0 % (ref 0.0–0.2)
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
## 2019-06-30 MED ORDER — KETOROLAC TROMETHAMINE 15 MG/ML IJ SOLN
15.0000 mg | Freq: Four times a day (QID) | INTRAMUSCULAR | Status: AC
  Administered 2019-06-30 – 2019-07-01 (×4): 15 mg via INTRAVENOUS
  Filled 2019-06-30 (×4): qty 1

## 2019-06-30 NOTE — Progress Notes (Signed)
Patient complaining of intense pain over right side of chest on movement, to the point he become tearful and sweaty. Says it has worse than before. Simmie Davies RN
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
## 2019-06-30 NOTE — Progress Notes (Signed)
OT Cancellation Note  Patient Details Name: Derek [name] MRN: [id number] DOB: 13-Jun-1991   Cancelled Treatment:    Reason Eval/Treat Not Completed: Pain limiting ability to participate. Pt scheduled for pain meds @ 1230. Ice packs provided for ribs and R shoulder. Will return later.   Ramond Dial, OT/L   Acute OT Clinical Specialist Acute Rehabilitation Services Pager [phone] Office [phone]  06/30/2019, 12:07 PM
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
## 2019-07-01 MED ORDER — DOCUSATE SODIUM 100 MG PO CAPS: 100.0000 mg | ORAL_CAPSULE | Freq: Two times a day (BID) | ORAL | 0 refills | Status: DC

## 2019-07-01 MED ORDER — METHOCARBAMOL 500 MG PO TABS: 500.0000 mg | ORAL_TABLET | Freq: Four times a day (QID) | ORAL | 0 refills | Status: DC

## 2019-07-01 MED ORDER — GABAPENTIN 300 MG PO CAPS: 300.0000 mg | ORAL_CAPSULE | Freq: Three times a day (TID) | ORAL | 0 refills | Status: DC

## 2019-07-01 MED ORDER — OXYCODONE HCL 5 MG PO TABS: 5.0000 mg | ORAL_TABLET | ORAL | 0 refills | Status: DC | PRN

## 2019-07-01 MED ORDER — ACETAMINOPHEN 500 MG PO TABS: 1000.0000 mg | ORAL_TABLET | Freq: Four times a day (QID) | ORAL | 0 refills | Status: AC

## 2019-07-01 MED ORDER — BACITRACIN ZINC 500 UNIT/GM EX OINT: TOPICAL_OINTMENT | Freq: Two times a day (BID) | CUTANEOUS | 0 refills | Status: DC
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
## 2019-08-05 ENCOUNTER — Encounter: Payer: Self-pay | Admitting: Family Medicine

## 2019-08-20 ENCOUNTER — Encounter: Payer: Self-pay | Admitting: Family Medicine

## 2019-08-20 ENCOUNTER — Other Ambulatory Visit: Payer: Self-pay

## 2019-08-20 ENCOUNTER — Ambulatory Visit (INDEPENDENT_AMBULATORY_CARE_PROVIDER_SITE_OTHER): Payer: 59 | Admitting: Family Medicine

## 2019-08-20 DIAGNOSIS — Z8781 Personal history of (healed) traumatic fracture: Secondary | ICD-10-CM
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
## 2019-08-21 ENCOUNTER — Encounter: Payer: Self-pay | Admitting: Family Medicine

## 2019-09-15 ENCOUNTER — Other Ambulatory Visit: Payer: Self-pay | Admitting: *Deleted

## 2019-09-15 DIAGNOSIS — Z20822 Contact with and (suspected) exposure to covid-19: Secondary | ICD-10-CM

## 2019-09-17 LAB — NOVEL CORONAVIRUS, NAA: SARS-CoV-2, NAA: NOT DETECTED

## 2020-01-06 ENCOUNTER — Ambulatory Visit: Payer: 59 | Admitting: Family Medicine

## 2020-01-06 DIAGNOSIS — Z0289 Encounter for other administrative examinations: Secondary | ICD-10-CM

## 2020-03-13 ENCOUNTER — Encounter: Payer: Self-pay | Admitting: Family Medicine

## 2020-03-13 ENCOUNTER — Telehealth: Payer: Self-pay | Admitting: Family Medicine

## 2020-03-13 ENCOUNTER — Ambulatory Visit (INDEPENDENT_AMBULATORY_CARE_PROVIDER_SITE_OTHER): Payer: 59 | Admitting: Family Medicine

## 2020-03-13 ENCOUNTER — Other Ambulatory Visit: Payer: Self-pay

## 2020-03-13 DIAGNOSIS — U071 COVID-19: Secondary | ICD-10-CM | POA: Diagnosis not present

## 2020-03-13 DIAGNOSIS — R05 Cough: Secondary | ICD-10-CM

## 2020-03-13 DIAGNOSIS — R059 Cough, unspecified: Secondary | ICD-10-CM

## 2020-03-13 MED ORDER — BENZONATATE 100 MG PO CAPS: 100.0000 mg | ORAL_CAPSULE | Freq: Three times a day (TID) | ORAL | 0 refills | Status: DC | PRN

## 2020-03-13 MED ORDER — HYDROCOD POLST-CPM POLST ER 10-8 MG/5ML PO SUER: 5.0000 mL | Freq: Two times a day (BID) | ORAL | 0 refills | Status: DC | PRN

## 2020-03-13 NOTE — Progress Notes (Signed)
Virtual Visit via Video Note  I connected with Derek [name] on 03/13/20 at  4:15 PM EDT by a video enabled telemedicine application and verified that I am speaking with the correct person using two identifiers.  Location: Patient: In his home Provider: [facility] Persons participating in virtual visit: Patient and provider   I discussed the limitations of evaluation and management by telemedicine and the availability of in person appointments. The patient expressed understanding and agreed to proceed.  History of Present Illness: Chief Complaint  Patient presents with  . Cough    Diagnosed with COVID on 4/7, productive cough, body aches, fatigue - Taking Dayquil with some relief    This is a 29 yo male who presents today with above cc.  Patient began with symptoms of fatigue, body aches, cough, headache April 5.  He was tested for Covid on April 7 with a rapid test which was positive.  He reports that he is feeling a little bit better but that his cough is keeping him up at night.  He denies fever, chest pain, loss of smell or taste, shortness of breath.  He does have a mild sore throat and headache in his sinus area.  Cough is occasionally productive of phlegm.  He has taken some DayQuil with some improvement of symptoms.  He is not sure where he became infected as he has no known sick contacts.   Observations/Objective: Patient is alert and answers questions appropriately.  Visible skin is unremarkable.  He is normally conversive without increased work of breathing, audible wheeze.  2 episodes of cough witnessed.  Eyes appear a little puffy.  Mood and affect appropriate. There were no vitals taken for this visit. Wt Readings from Last 3 Encounters:  08/20/19 233 lb (105.7 kg)  06/27/19 220 lb (99.8 kg)  02/05/19 222 lb 12.8 oz (101.1 kg)   BP Readings from Last 3 Encounters:  08/20/19 124/84  07/01/19 128/72  02/05/19 112/64    Assessment and Plan: 1. COVID-19 virus  infection -Discussed expected course of illness -Reviewed ER/UC/follow-up precautions with patient including signs symptoms of secondary infection -Reviewed symptomatic treatment measures including over-the-counter analgesics, adequate hydration, rest - benzonatate (TESSALON) 100 MG capsule; Take 1-2 capsules (100-200 mg total) by mouth 3 (three) times daily as needed.  Dispense: 30 capsule; Refill: 0 - chlorpheniramine-HYDROcodone (TUSSIONEX PENNKINETIC ER) 10-8 MG/5ML SUER; Take 5 mLs by mouth every 12 (twelve) hours as needed for cough. May cause drowsiness. Do not drive/ operate heavy machinery after taking.  Dispense: 70 mL; Refill: 0 -Reviewed quarantine instructions with patient  2. Cough - benzonatate (TESSALON) 100 MG capsule; Take 1-2 capsules (100-200 mg total) by mouth 3 (three) times daily as needed.  Dispense: 30 capsule; Refill: 0 - chlorpheniramine-HYDROcodone (TUSSIONEX PENNKINETIC ER) 10-8 MG/5ML SUER; Take 5 mLs by mouth every 12 (twelve) hours as needed for cough. May cause drowsiness. Do not drive/ operate heavy machinery after taking.  Dispense: 70 mL; Refill: 0   [name], FNP-BC  [facility], [facility]  03/13/2020 5:13 PM   Follow Up Instructions:    I discussed the assessment and treatment plan with the patient. The patient was provided an opportunity to ask questions and all were answered. The patient agreed with the plan and demonstrated an understanding of the instructions.   The patient was advised to call back or seek an in-person evaluation if the symptoms worsen or if the condition fails to improve  as anticipated.     [name], FNP

## 2020-03-13 NOTE — Telephone Encounter (Signed)
Debbie, please advise.  

## 2020-03-13 NOTE — Telephone Encounter (Signed)
Pt scheduled today at 4:15  Nothing further needed.

## 2020-03-13 NOTE — Telephone Encounter (Signed)
I am unable to send in medication without a visit. Please see if he wants virtual visit at 4:15 today?

## 2020-03-13 NOTE — Telephone Encounter (Signed)
Pt called and states that he tested positive for Covid on Wed 4/7 Pt c/o cough, congestion, runny nose, head congestion, SOB  Denies fever, loss of taste/smell.   He is requesting something me called into [facility] for his cough, he is not able to sleep.

## 2020-03-22 NOTE — Progress Notes (Signed)
I attempted to contact patient - Mailbox was full. I was try again later.

## 2020-03-23 ENCOUNTER — Other Ambulatory Visit: Payer: Self-pay | Admitting: Family Medicine

## 2020-03-23 DIAGNOSIS — R059 Cough, unspecified: Secondary | ICD-10-CM

## 2020-03-23 DIAGNOSIS — U071 COVID-19: Secondary | ICD-10-CM

## 2020-03-23 DIAGNOSIS — R05 Cough: Secondary | ICD-10-CM

## 2020-03-23 NOTE — Telephone Encounter (Signed)
Patient called.  Patient said he was diagnosed with covid 2 weeks ago.  Patient was given cough medicine.  Patient said he's out of the cough medicine and still coughing.  Patient wants to know if a refill for cough medicine can be sent to [facility] by Karin Golden.

## 2020-03-24 MED ORDER — HYDROCOD POLST-CPM POLST ER 10-8 MG/5ML PO SUER: 5.0000 mL | Freq: Two times a day (BID) | ORAL | 0 refills | Status: DC | PRN

## 2020-03-24 NOTE — Telephone Encounter (Signed)
Derek [name], please advise.  

## 2020-03-24 NOTE — Telephone Encounter (Signed)
Please call patient and find out which cough medication he wants refilled- liquid or pills? Is he having any fever, wheeze, shortness of breath or sputum production? If yes to any of the above, see if he is open to a virtual visit to discuss symptoms.

## 2020-03-24 NOTE — Telephone Encounter (Signed)
I attempted to contact patient but his VM box is full and I am unable to leave a message. I will try again later today.

## 2020-03-24 NOTE — Telephone Encounter (Signed)
I contacted patient again. Patient states overall he is feeling much better and he feels like he has his energy back. Patient denies any fever, wheezing, shortness of breath or sputum production. Patient states he does have a dry cough that is still lingering.  He would like a refill of the liquid cough medication sent in to the [facility] [address].
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
## 2020-10-15 IMAGING — RF RIGHT CLAVICLE - 2+ VIEWS
1 series · 6 of 6 positions shown · non-contrast
Comparison: Chest x-ray 06/28/2019.

CLINICAL DATA: ORIF right clavicle.

EXAM:
RIGHT CLAVICLE - 2+ VIEWS; DG C-ARM 61-120 MIN

[Series 1: run · 6 of 6 slices shown]
[im 1/6]
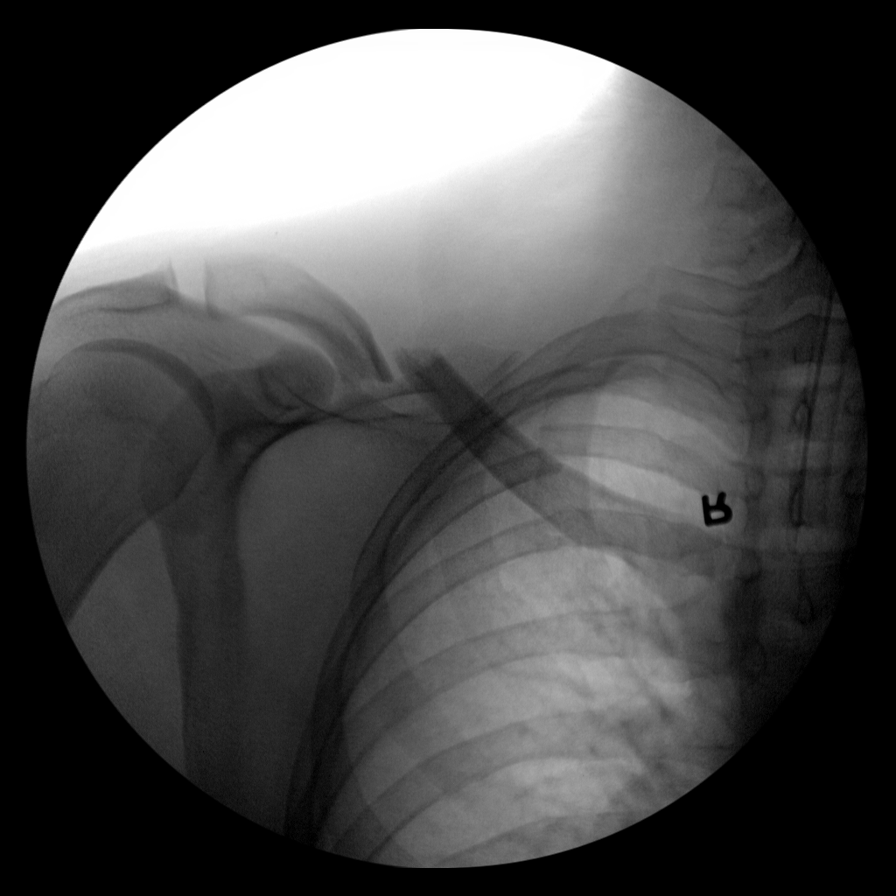
[im 2/6]
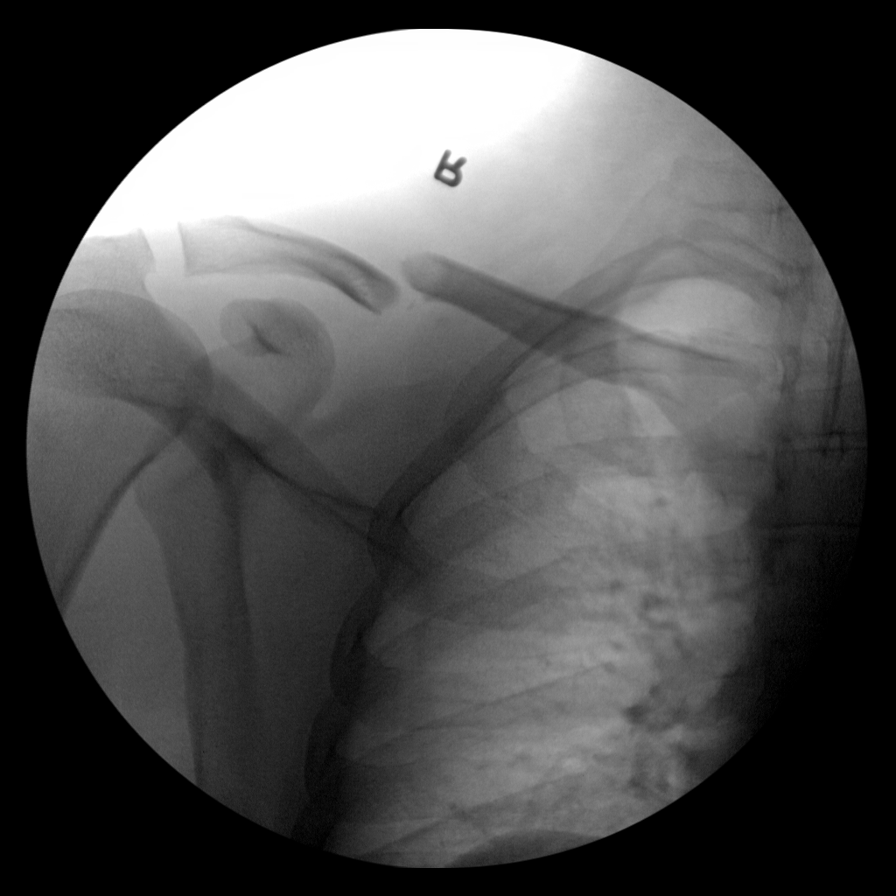
[im 3/6]
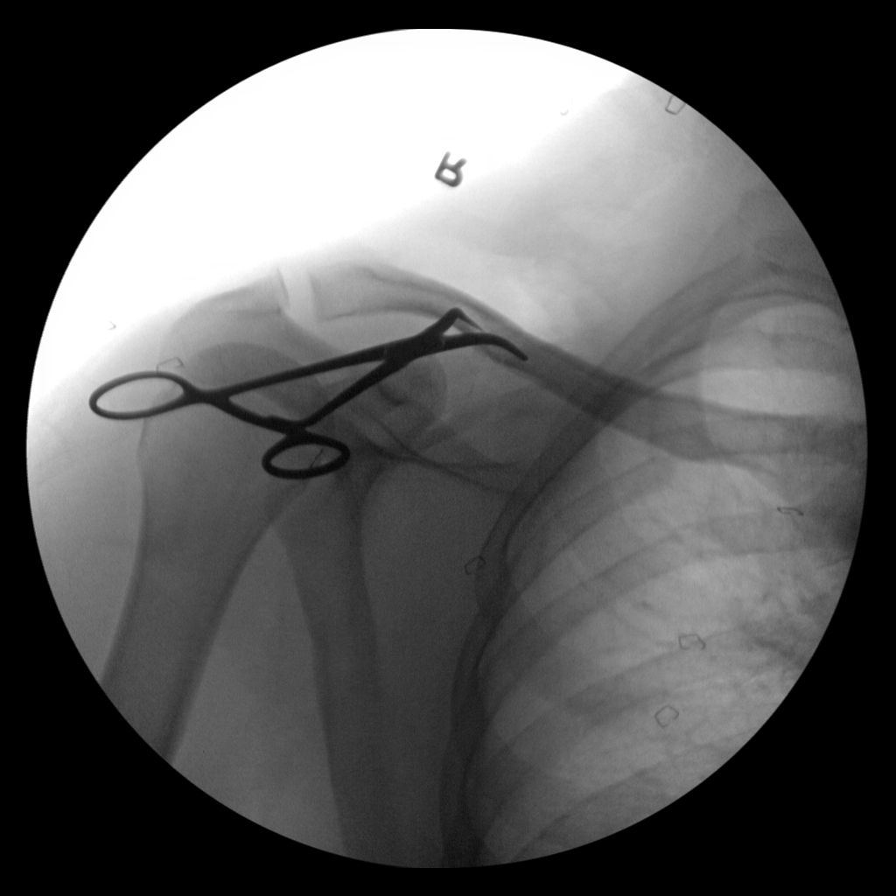
[im 4/6]
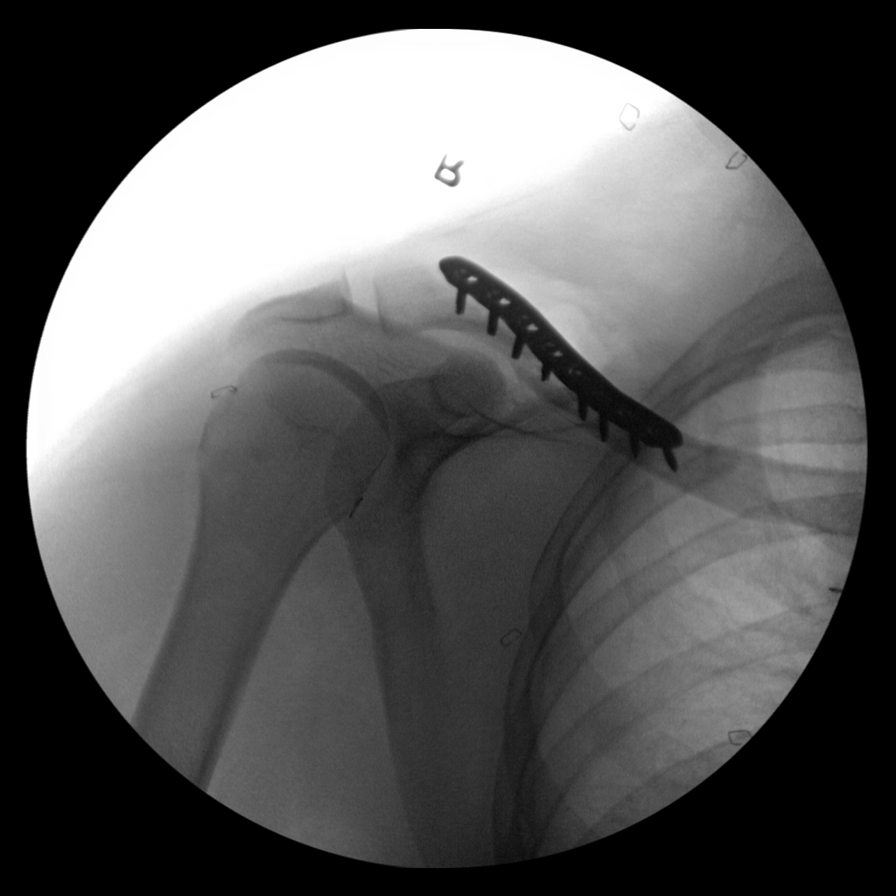
[im 5/6]
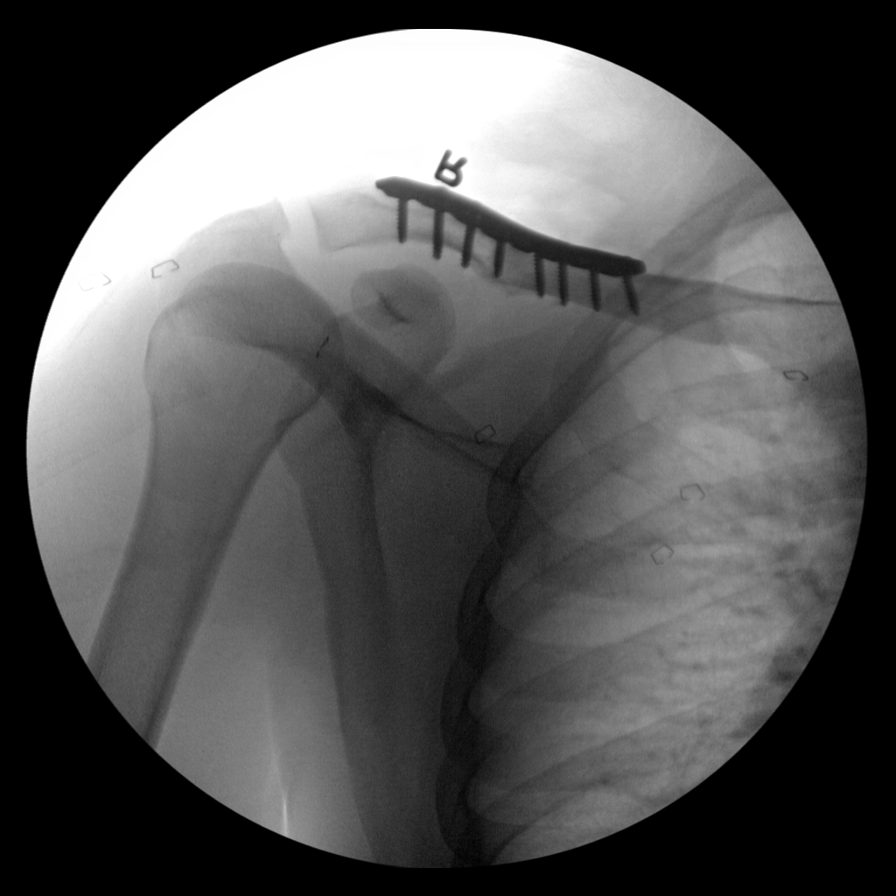
[im 6/6]
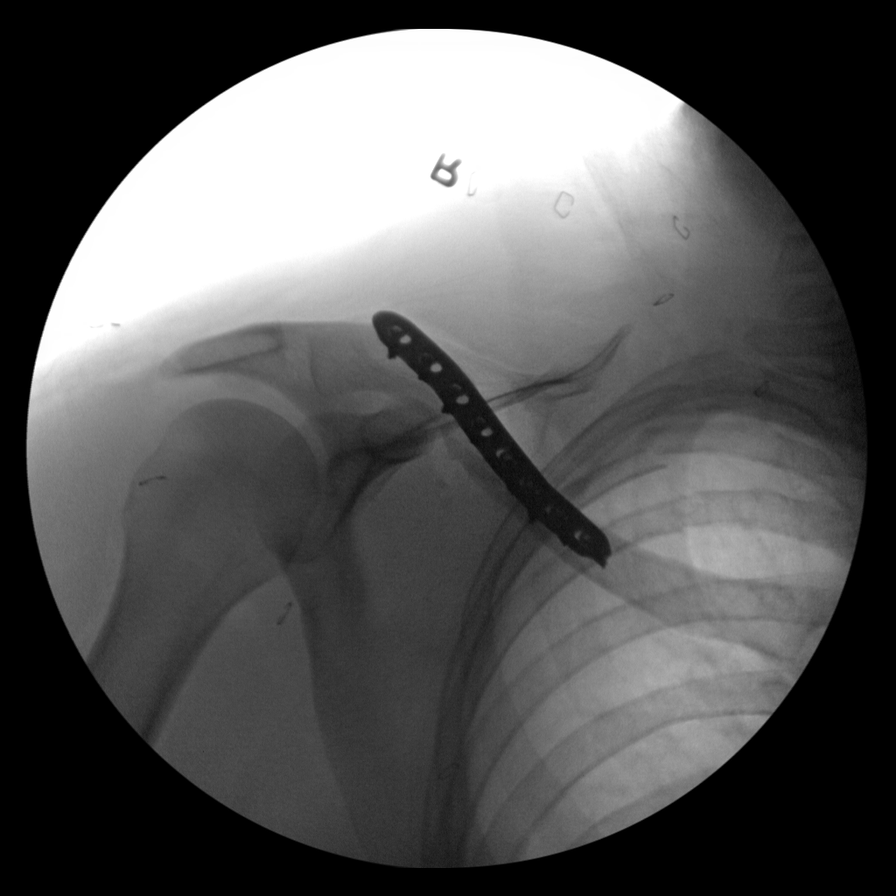

[6 of 6 positions shown; findings below may reference images not displayed]

FINDINGS: ORIF right clavicle. Hardware intact. Anatomic alignment. Surgical
staples noted over the chest. 0 minutes 26 seconds fluoroscopy time
utilized.
IMPRESSION: ORIF right clavicle with anatomic alignment.

## 2020-10-15 IMAGING — DX RIGHT CLAVICLE - 2+ VIEWS
1 series · 2 of 2 positions shown · non-contrast
Comparison: 06/28/2019 and 06/27/2019

CLINICAL DATA: Fracture

EXAM:
RIGHT CLAVICLE - 2+ VIEWS

[Series 1: clavicle · 0.14mm/px · 2 of 2 slices shown]
[im 1/2]
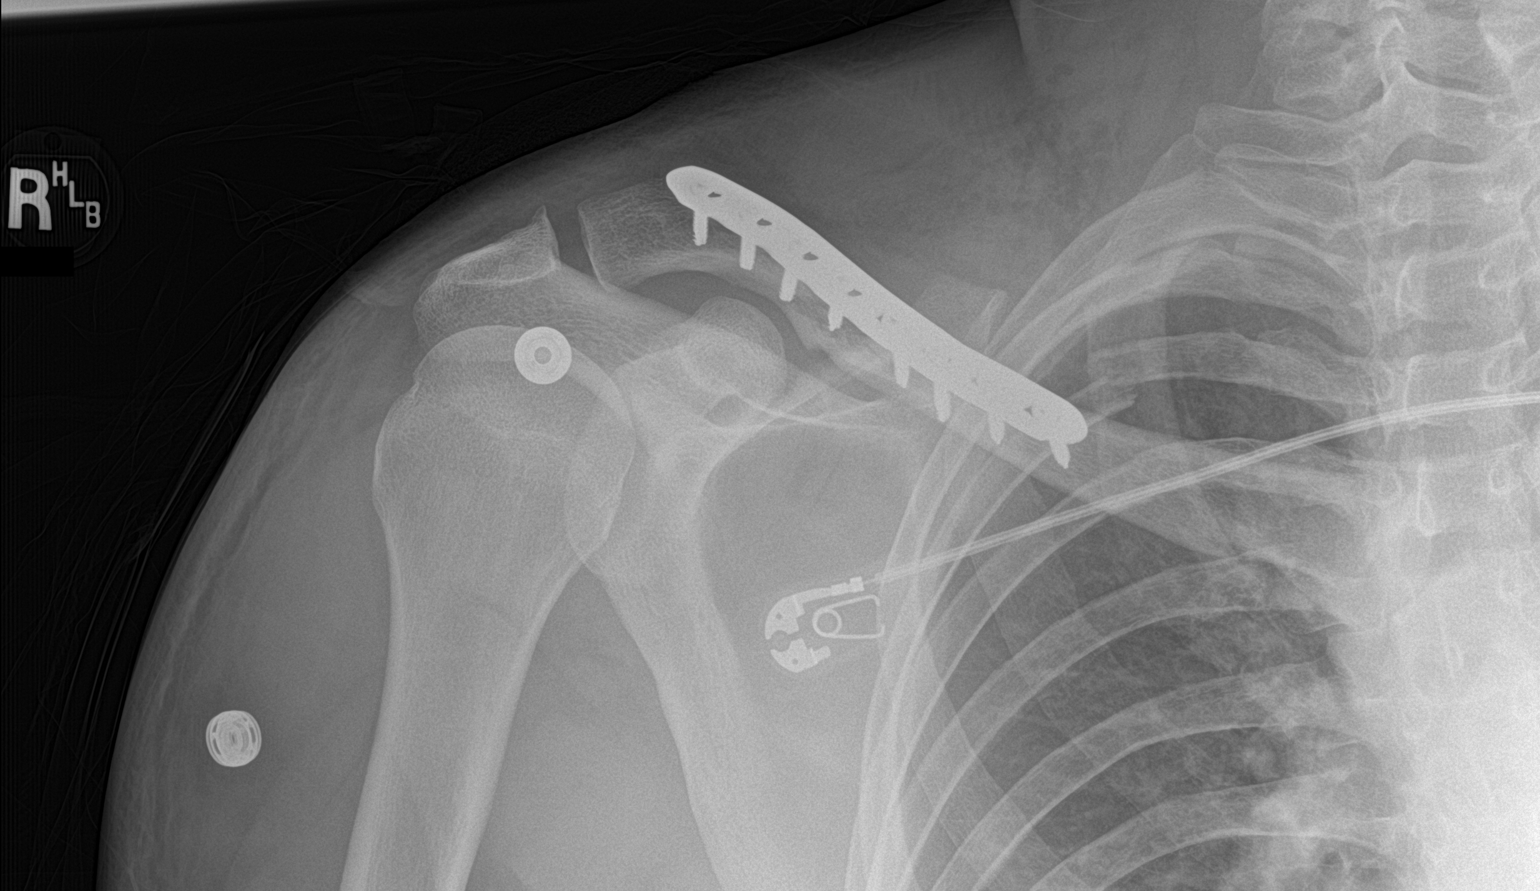
[im 2/2]
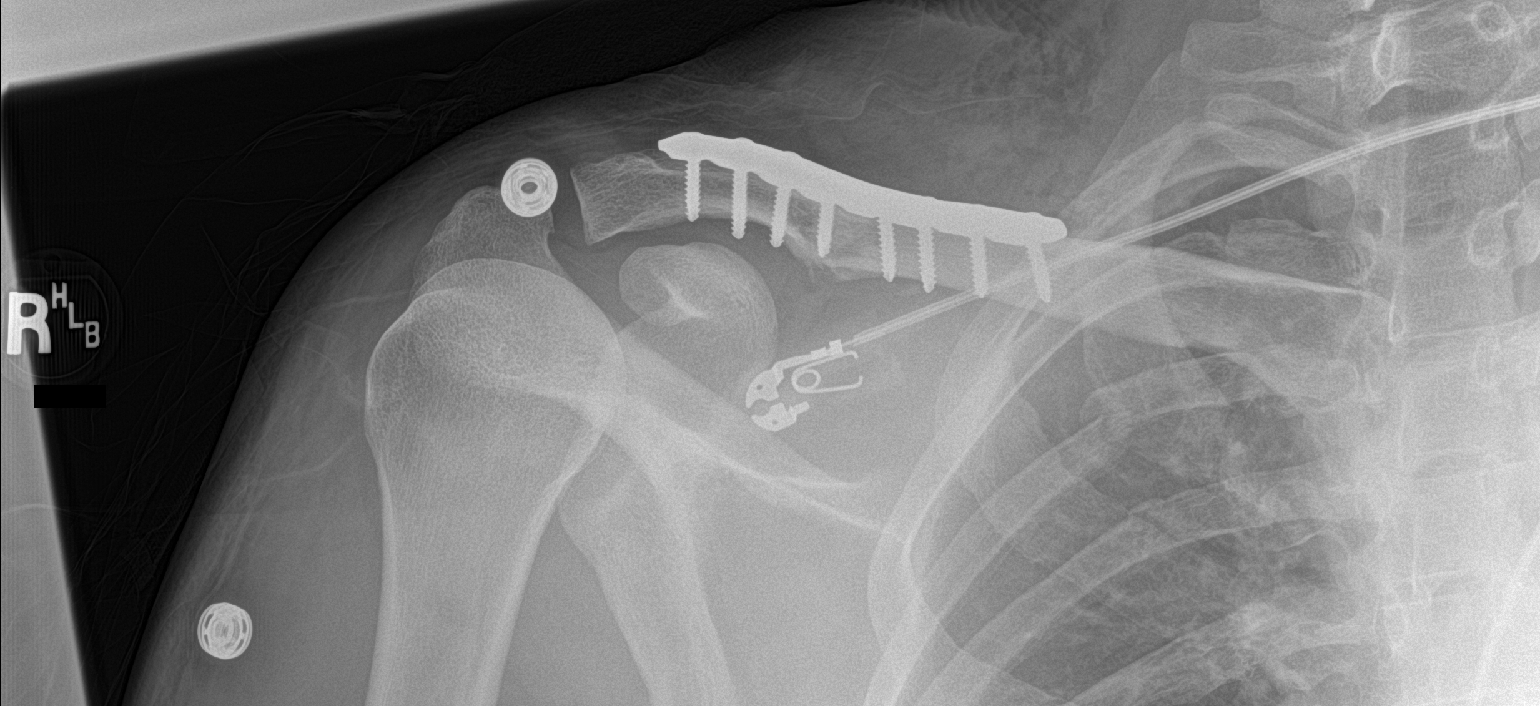

[2 of 2 positions shown; findings below may reference images not displayed]

FINDINGS: Interval ORIF of the RIGHT clavicle with a screw plate. Alignment is
near anatomic. RIGHT rib fractures.
IMPRESSION: ORIF of the RIGHT clavicle.

## 2022-02-08 ENCOUNTER — Other Ambulatory Visit: Payer: Self-pay

## 2022-02-08 ENCOUNTER — Ambulatory Visit: Payer: Self-pay | Admitting: Family Medicine

## 2022-02-08 ENCOUNTER — Encounter: Payer: Self-pay | Admitting: Family Medicine

## 2022-02-08 DIAGNOSIS — Z113 Encounter for screening for infections with a predominantly sexual mode of transmission: Secondary | ICD-10-CM

## 2022-02-08 DIAGNOSIS — N341 Nonspecific urethritis: Secondary | ICD-10-CM

## 2022-02-08 LAB — HM HIV SCREENING LAB: HM HIV Screening: NEGATIVE

## 2022-02-08 LAB — GRAM STAIN

## 2022-02-08 MED ORDER — DOXYCYCLINE HYCLATE 100 MG PO TABS: 100.0000 mg | ORAL_TABLET | Freq: Two times a day (BID) | ORAL | 0 refills | Status: AC

## 2022-02-08 NOTE — Progress Notes (Signed)
Gram stain reviewed, negative. Patient treated with Doxycycline per providers orders.Burt Knack, RN  ?

## 2022-02-08 NOTE — Progress Notes (Signed)
Patient here for STD testing.Hymie Gorr Brewer-Jensen, RN 
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
## 2022-02-13 LAB — GONOCOCCUS CULTURE

## 2022-02-15 ENCOUNTER — Encounter: Payer: 59 | Admitting: Nurse Practitioner

## 2022-11-28 ENCOUNTER — Emergency Department
Admission: EM | Admit: 2022-11-28 | Discharge: 2022-11-28 | Disposition: A | Discharge status: Home / Self Care | Payer: BLUE CROSS/BLUE SHIELD | Attending: Emergency Medicine | Admitting: Emergency Medicine

## 2022-11-28 ENCOUNTER — Emergency Department: Payer: BLUE CROSS/BLUE SHIELD

## 2022-11-28 ENCOUNTER — Other Ambulatory Visit: Payer: Self-pay

## 2022-11-28 DIAGNOSIS — R109 Unspecified abdominal pain: Secondary | ICD-10-CM | POA: Diagnosis not present

## 2022-11-28 DIAGNOSIS — M549 Dorsalgia, unspecified: Secondary | ICD-10-CM | POA: Insufficient documentation

## 2022-11-28 LAB — URINALYSIS, ROUTINE W REFLEX MICROSCOPIC
Bacteria, UA: NONE SEEN
Bilirubin Urine: NEGATIVE
Glucose, UA: NEGATIVE mg/dL
Hgb urine dipstick: NEGATIVE
Ketones, ur: NEGATIVE mg/dL
Leukocytes,Ua: NEGATIVE
Nitrite: NEGATIVE
Protein, ur: NEGATIVE mg/dL
Specific Gravity, Urine: 1.014 (ref 1.005–1.030)
pH: 7 (ref 5.0–8.0)

## 2022-11-28 LAB — BASIC METABOLIC PANEL
Anion gap: 5 (ref 5–15)
BUN: 8 mg/dL (ref 6–20)
CO2: 26 mmol/L (ref 22–32)
Calcium: 9.1 mg/dL (ref 8.9–10.3)
Chloride: 109 mmol/L (ref 98–111)
Creatinine, Ser: 0.97 mg/dL (ref 0.61–1.24)
GFR, Estimated: >60 mL/min (ref >60)
Glucose, Bld: 99 mg/dL (ref 70–99)
Potassium: 4.1 mmol/L (ref 3.5–5.1)
Sodium: 140 mmol/L (ref 135–145)

## 2022-11-28 LAB — CBC
HCT: 48.1 % (ref 39.0–52.0)
Hemoglobin: 16.2 g/dL (ref 13.0–17.0)
MCH: 31.8 pg (ref 26.0–34.0)
MCHC: 33.7 g/dL (ref 30.0–36.0)
MCV: 94.3 fL (ref 80.0–100.0)
Platelets: 248 10*3/uL (ref 150–400)
RBC: 5.1 MIL/uL (ref 4.22–5.81)
RDW: 12.5 % (ref 11.5–15.5)
WBC: 6.1 10*3/uL (ref 4.0–10.5)
nRBC: 0 % (ref 0.0–0.2)

## 2022-11-28 LAB — LIPASE, BLOOD: Lipase: 37 U/L (ref 11–51)

## 2022-11-28 MED ORDER — KETOROLAC TROMETHAMINE 15 MG/ML IJ SOLN
15.0000 mg | Freq: Once | INTRAMUSCULAR | Status: AC
  Administered 2022-11-28: 15 mg via INTRAMUSCULAR
  Filled 2022-11-28: qty 1

## 2022-11-28 NOTE — ED Triage Notes (Signed)
First Nurse Note:  C/O right lower back pain that radiates through body into right groin.  Denies injury.  Pain initially on 12/11, improved but then returned and worsened over the past weekend.

## 2022-11-28 NOTE — ED Triage Notes (Signed)
Pt reports woke up 2 Mondays ago with some back pain on the right side. Pt repots pain now radiates down to his right groin and right leg. Pt denies hx of back problems. Pt reports pain is constant and gets worse when he puts pressure on that right side

## 2022-11-28 NOTE — ED Provider Notes (Signed)
[facility] Provider Note    Event Date/Time   First MD Initiated Contact with Patient 11/28/22 1634     (approximate)   History   Back Pain   HPI  Derek [name] is a 31 y.o. male no significant past medical history who presents to the emergency department with right-sided back pain.  Endorses ongoing right-sided back pain for the past 2 weeks that has been intermittent.  States that he noted right-sided back pain approximately 2 weeks ago, had some improvement and then worsened over the past couple of days.  States that he was evaluated yesterday and started on a muscle relaxer and Toradol without significant improvement so he wanted to be evaluated again today.  Does endorse heavy lifting and was working out.  No midline back pain.  No lower extremity weakness.  No numbness to lower extremity or urinary or bowel incontinence.  Endorses a sharp stabbing pain that radiates to his right testicle.  Denies any ongoing testicular pain.  No active nausea or vomiting.  No falls or trauma.  States that his pain is worse with movement or laying on the right side.     Physical Exam   Triage Vital Signs: ED Triage Vitals  Enc Vitals Group     BP 11/28/22 1502 (!) 147/95     Pulse Rate 11/28/22 1502 88     Resp 11/28/22 1502 20     Temp 11/28/22 1509 97.9 F (36.6 C)     Temp Source 11/28/22 1509 Oral     SpO2 11/28/22 1502 99 %     Weight 11/28/22 1502 230 lb (104.3 kg)     Height 11/28/22 1502 6\' 6"  (1.981 m)     Head Circumference --      Peak Flow --      Pain Score 11/28/22 1502 9     Pain Loc --      Pain Edu? --      Excl. in GC? --     Most recent vital signs: Vitals:   11/28/22 1509 11/28/22 1818  BP:    Pulse:    Resp:  18  Temp: 97.9 F (36.6 C)   SpO2:      Physical Exam Constitutional:      Appearance: He is well-developed.  HENT:     Head: Atraumatic.  Eyes:     Conjunctiva/sclera: Conjunctivae normal.  Cardiovascular:      Rate and Rhythm: Regular rhythm.  Pulmonary:     Effort: No respiratory distress.  Abdominal:     Tenderness: There is abdominal tenderness.     Hernia: There is no hernia in the left inguinal area or right inguinal area.  Genitourinary:    Testes: Normal. Cremasteric reflex is present.     Epididymis:     Right: Normal.     Left: Normal.  Musculoskeletal:     Cervical back: Normal range of motion.     Comments: No midline thoracic or lumbar tenderness to palpation.  5/5 strength bilateral lower extremities.  Ambulating without any difficulties.  Skin:    General: Skin is warm.  Neurological:     Mental Status: He is alert. Mental status is at baseline.     IMPRESSION / MDM / ASSESSMENT AND PLAN / ED COURSE  I reviewed the triage vital signs and the nursing notes.  Differential diagnosis including kidney stone, pyelonephritis, musculoskeletal strain, testicular torsion, epididymitis, hernia, appendicitis.  Patient without signs or symptoms of  cauda equina or epidural compression syndrome do not feel that an emergent MRI is necessary at this time.  RADIOLOGY I independently reviewed imaging, my interpretation of imaging: CT abdomen and pelvis no acute intra-abdominal pathology.  Noted intussusception to the left upper quadrant most commonly incidental finding.  Subtle sclerosis of bilateral femoral heads.  LABS (all labs ordered are listed, but only abnormal results are displayed) Labs interpreted as -    Labs Reviewed  URINALYSIS, ROUTINE W REFLEX MICROSCOPIC - Abnormal; Notable for the following components:      Result Value   Color, Urine YELLOW (*)    APPearance CLEAR (*)    All other components within normal limits  BASIC METABOLIC PANEL  CBC  LIPASE, BLOOD    TREATMENT   Patient was given IM ketorolac.  My exam no signs or symptoms concerning for testicular torsion.  No midline back pain or red flags for his back do not feel that MRI is necessary at this time.   Clinical picture is not consistent with an intussusception, no abdominal pain or nausea vomiting.  Discussed incidental finding with the patient of possible subtle sclerosis and avascular sclerosis of his bilateral femoral heads however do not believe that this is causing his right-sided flank pain today.  Discussed NSAIDs and muscle relaxer, discussed close follow-up with primary care provider.  Given return precautions that if his symptoms do not improve in the next couple of days he would need to be reevaluated.  If his symptoms worsened to return to the emergency department.   PROCEDURES:  Critical Care performed: No  Procedures  Patient's presentation is most consistent with acute presentation with potential threat to life or bodily function.   MEDICATIONS ORDERED IN ED: Medications  ketorolac (TORADOL) 15 MG/ML injection 15 mg (15 mg Intramuscular Given 11/28/22 1752)    FINAL CLINICAL IMPRESSION(S) / ED DIAGNOSES   Final diagnoses:  Acute right-sided back pain, unspecified back location  Abdominal pain, unspecified abdominal location     Rx / DC Orders   ED Discharge Orders     None        Note:  This document was prepared using Dragon voice recognition software and may include unintentional dictation errors.   Corena Herter, MD 11/28/22 [phone]

## 2022-11-28 NOTE — Discharge Instructions (Addendum)
You are seen in the emergency department for right-sided back pain.  You had a CT scan done of your abdomen and pelvis that did not show any signs of a kidney stone.  Your urine did not show any signs of infection or signs of blood.  Your CT scan mentioned bilateral sclerosis of both of your femoral heads which could be an early sign of avascular necrosis.  This was an incidental finding, do not believe that this is causing your pain today.  You do need to follow-up with her primary care physician so they can follow this finding and make sure that your symptoms are improving.  It is importantly return to the emergency department if you have ongoing symptoms or worsening symptoms.  Pain control:  Ibuprofen (motrin/aleve/advil) - You can take 3-4 tablets (600-800 mg) every 6 hours as needed for pain/fever.  Acetaminophen (tylenol) - You can take 2 extra strength tablets (1000 mg) every 6 hours as needed for pain/fever.  You can alternate these medications or take them together.  Make sure you eat food/drink water when taking these medications. Do not take ketorolac and Motrin at the same time.  He is to ask similar.

## 2023-09-22 ENCOUNTER — Emergency Department (HOSPITAL_BASED_OUTPATIENT_CLINIC_OR_DEPARTMENT_OTHER): Payer: BC Managed Care – PPO | Admitting: Radiology

## 2023-09-22 ENCOUNTER — Encounter (HOSPITAL_BASED_OUTPATIENT_CLINIC_OR_DEPARTMENT_OTHER): Payer: Self-pay | Admitting: Urology

## 2023-09-22 ENCOUNTER — Emergency Department (HOSPITAL_BASED_OUTPATIENT_CLINIC_OR_DEPARTMENT_OTHER)
Admission: EM | Admit: 2023-09-22 | Discharge: 2023-09-22 | Disposition: A | Discharge status: Home / Self Care | Payer: BC Managed Care – PPO | Attending: Emergency Medicine | Admitting: Emergency Medicine

## 2023-09-22 DIAGNOSIS — S51812A Laceration without foreign body of left forearm, initial encounter: Secondary | ICD-10-CM | POA: Diagnosis present

## 2023-09-22 DIAGNOSIS — W268XXA Contact with other sharp object(s), not elsewhere classified, initial encounter: Secondary | ICD-10-CM | POA: Diagnosis not present

## 2023-09-22 MED ORDER — BACITRACIN ZINC 500 UNIT/GM EX OINT
TOPICAL_OINTMENT | Freq: Once | CUTANEOUS | Status: AC
  Filled 2023-09-22: qty 28.35

## 2023-09-22 MED ORDER — LIDOCAINE-EPINEPHRINE (PF) 2 %-1:200000 IJ SOLN
20.0000 mL | Freq: Once | INTRAMUSCULAR | Status: AC
  Administered 2023-09-22: 20 mL
  Filled 2023-09-22: qty 20

## 2023-09-22 NOTE — ED Notes (Signed)
Reviewed discharge instructions and home care with pt. Pt verbalized understanding and had no further questions. Pt exited ED without complications.

## 2023-09-22 NOTE — ED Provider Notes (Signed)
[place] EMERGENCY DEPARTMENT AT [facility] Provider Note   CSN: [id number] Arrival date & time: 09/22/23  1737     History  Chief Complaint  Patient presents with   Laceration    Derek [name] is a 32 y.o. male with no significant past medical history who presents the ED today for a laceration.  Reports he was working on his bike when he tripped and cut his left forearm on a piece of metal.  This occurred about 30 minutes prior to arrival.  No head injury or LOC at the time of the fall.  He denies weakness, numbness, or tingling to his left upper extremity.  Tetanus is up-to-date.  No additional complaints or concerns at this time.    Home Medications Prior to Admission medications   Medication Sig Start Date End Date Taking? Authorizing Provider  benzonatate (TESSALON) 100 MG capsule Take 1-2 capsules (100-200 mg total) by mouth 3 (three) times daily as needed. 03/13/20   Emi Belfast, FNP  chlorpheniramine-HYDROcodone (TUSSIONEX PENNKINETIC ER) 10-8 MG/5ML SUER Take 5 mLs by mouth every 12 (twelve) hours as needed for cough. May cause drowsiness. Do not drive/ operate heavy machinery after taking. 03/24/20   Emi Belfast, FNP      Allergies    Patient has no known allergies.    Review of Systems   Review of Systems  Skin:        Left forearm laceration  All other systems reviewed and are negative.   Physical Exam Updated Vital Signs BP 119/71   Pulse 87   Temp 98.3 F (36.8 C) (Oral)   Resp 18   Ht 6\' 6"  (1.981 m)   Wt 99.8 kg   SpO2 100%   BMI 25.42 kg/m  Physical Exam Vitals and nursing note reviewed.  Constitutional:      General: He is not in acute distress.    Appearance: Normal appearance.  HENT:     Head: Normocephalic and atraumatic.     Mouth/Throat:     Mouth: Mucous membranes are moist.  Eyes:     Conjunctiva/sclera: Conjunctivae normal.     Pupils: Pupils are equal, round, and reactive to light.  Cardiovascular:      Rate and Rhythm: Normal rate and regular rhythm.     Pulses: Normal pulses.     Heart sounds: Normal heart sounds.  Pulmonary:     Effort: Pulmonary effort is normal.     Breath sounds: Normal breath sounds.  Abdominal:     Palpations: Abdomen is soft.     Tenderness: There is no abdominal tenderness.  Musculoskeletal:     Comments: Laceration to the dorsum of the left forearm.  Compartment is soft.  No weakness, numbness, loss of sensation to the left upper extremity.  Upper extremity strength appreciated.  Radial pulse intact.  Capillary refills within 2 seconds.  Skin:    General: Skin is warm and dry.     Capillary Refill: Capillary refill takes less than 2 seconds.     Findings: No rash.     Comments: Laceration to dorsum of left forearm, not actively bleeding at time of examination  Neurological:     General: No focal deficit present.     Mental Status: He is alert.     Sensory: No sensory deficit.     Motor: No weakness.  Psychiatric:        Mood and Affect: Mood normal.  Behavior: Behavior normal.    ED Results / Procedures / Treatments   Labs (all labs ordered are listed, but only abnormal results are displayed) Labs Reviewed - No data to display  EKG None  Radiology No results found.  Procedures .Marland KitchenLaceration Repair  Date/Time: 09/22/2023 10:07 PM  Performed by: Maxwell Marion, PA-C Authorized by: Maxwell Marion, PA-C   Consent:    Consent obtained:  Verbal   Consent given by:  Patient   Risks discussed:  Pain, retained foreign body and infection Laceration details:    Location:  Shoulder/arm   Shoulder/arm location:  L lower arm   Length (cm):  4 Exploration:    Hemostasis achieved with:  Direct pressure   Imaging outcome: foreign body not noted   Treatment:    Area cleansed with:  Saline Skin repair:    Repair method:  Sutures   Suture size:  4-0   Suture material:  Prolene   Suture technique:  Simple interrupted Repair type:    Repair  type:  Simple Post-procedure details:    Dressing:  Antibiotic ointment and non-adherent dressing   Procedure completion:  Tolerated well, no immediate complications     Medications Ordered in ED Medications  lidocaine-EPINEPHrine (XYLOCAINE W/EPI) 2 %-1:200000 (PF) injection 20 mL (20 mLs Infiltration Given 09/22/23 2220)  bacitracin ointment ( Topical Given 09/22/23 2219)    ED Course/ Medical Decision Making/ A&P                                 Medical Decision Making Amount and/or Complexity of Data Reviewed Radiology: ordered.  Risk Prescription drug management.   This patient presents to the ED for concern of left forearm laceration, this involves an extensive number of treatment options, and is a complaint that carries with it a high risk of complications and morbidity.   Differential diagnosis includes: abrasion, laceration with vs without foreign body, etc.   Comorbidities  No significant past medical history   Additional History  Additional history obtained from previous records.   Lab Tests  Not indicated at this time.   Imaging Studies  Discussed pros and cons of imaging with patient to identify if foreign body is present. Patient denied imaging - does not believe a piece of metal broke off.   Problem List / ED Course / Critical Interventions / Medication Management  Left forearm laceration I ordered medications including: Lidocaine for intradermal analgesic Bacitracin ointment for antibiotic  Reevaluation of the patient after these medicines showed that the patient improved I have reviewed the patients home medicines and have made adjustments as needed   Social Determinants of Health  Transportation   Test / Admission - Considered  Discussed findings with patient. He is hemodynamically stable and safe for discharge home. Return precautions provided.       Final Clinical Impression(s) / ED Diagnoses Final diagnoses:  Laceration  of left forearm, initial encounter    Rx / DC Orders ED Discharge Orders     None         Maxwell Marion, PA-C 09/22/23 2318    Anders Simmonds T, DO 09/23/23 1645

## 2023-09-22 NOTE — ED Triage Notes (Addendum)
Pt states cut left arm on a piece of metal approx 30 min PTA  Deep lac noted, bleeding controlled  Tdap up to date

## 2023-09-22 NOTE — ED Notes (Signed)
Wound irrigated with SW. Tolerates well.

## 2023-09-22 NOTE — Discharge Instructions (Addendum)
As discussed, your sutures need to be removed in 7 days.  Leave the dressing on your laceration for the next 24 hours. After that, you can leave the wound open to air. After the first 24 hours, you can begin to clean the wound site with soap and water to prevent crusting over the suture knots.  Do not go into pools, lakes, or oceans until the sutures are removed in order to prevent infection.   Get help right away if: You have very bad swelling around the wound. Your pain suddenly gets worse and is very bad. You have painful lumps near the wound or on skin anywhere on your body. You have a red streak going away from your wound. The wound is on your hand, and: You cannot move a finger. Your fingers look pale or bluish.

## 2023-09-22 NOTE — ED Notes (Signed)
Patient refused xray of forearm. Explained the reasoning for the xray to determine if there was any retained foreign body/metal in the laceration. Patient reports it was not deep enough for an xray and politely refused.

## 2024-03-05 ENCOUNTER — Ambulatory Visit

## 2024-10-20 ENCOUNTER — Telehealth: Payer: Self-pay | Admitting: Family Medicine

## 2024-10-20 NOTE — Telephone Encounter (Signed)
 Please advise

## 2024-10-20 NOTE — Telephone Encounter (Signed)
 Copied from CRM #[id number]. Topic: Appointments - Scheduling Inquiry for Clinic >> Oct 20, 2024  9:01 AM Derek [name] wrote: Reason for CRM: Patient is calling to inquire to see if Dr. Johnny would make an exception to accept him as a new patient. Patient's girlfriend is an existing patient of Dr. Johnny, Derek [name].. and she recommended him to see Dr. Johnny as well. Patient would like to know if he can be seen by Dr. Johnny as well.   [phone] Derek [name])

## 2024-10-21 ENCOUNTER — Ambulatory Visit: Payer: Self-pay

## 2024-10-21 ENCOUNTER — Ambulatory Visit
Admission: EM | Admit: 2024-10-21 | Discharge: 2024-10-21 | Disposition: A | Discharge status: Home / Self Care | Attending: Emergency Medicine | Admitting: Emergency Medicine

## 2024-10-21 DIAGNOSIS — R051 Acute cough: Secondary | ICD-10-CM

## 2024-10-21 DIAGNOSIS — J0100 Acute maxillary sinusitis, unspecified: Secondary | ICD-10-CM | POA: Diagnosis not present

## 2024-10-21 MED ORDER — AZITHROMYCIN 250 MG PO TABS: 250.0000 mg | ORAL_TABLET | Freq: Every day | ORAL | 0 refills | Status: DC

## 2024-10-21 MED ORDER — PROMETHAZINE-DM 6.25-15 MG/5ML PO SYRP: 5.0000 mL | ORAL_SOLUTION | Freq: Four times a day (QID) | ORAL | 0 refills | Status: DC | PRN

## 2024-10-21 NOTE — ED Triage Notes (Signed)
 Patient to Urgent Care with complaints of intermittently productive cough/ headaches.  Worse at night when laying down. Symptoms x2 weeks.  Using robitussin/ nyquil/ ibuprofen .
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
## 2024-10-21 NOTE — Telephone Encounter (Signed)
 Yes I would be happy to see him

## 2024-10-21 NOTE — Discharge Instructions (Addendum)
 Follow up with your primary care provider tomorrow.  Go to the emergency department if you have worsening symptoms.    Take the Zithromax as directed.    Take the Promethazine DM as directed.  Do not drive, operate machinery, drink alcohol, or perform dangerous activities while taking this medication as it may cause drowsiness.

## 2024-10-21 NOTE — Telephone Encounter (Signed)
 Copied from CRM #[id number]. Topic: Clinical - Red Word Triage >> Oct 21, 2024 12:12 PM Viola F wrote: Red Word that prompted transfer to Nurse Triage: New patient having severe cough and possible bronchitis - requesting appt Reason for Disposition  [1] MILD difficulty breathing (e.g., minimal/no SOB at rest, SOB with walking, pulse < 100) AND [2] still present when not coughing  Answer Assessment - Initial Assessment Questions Additional info: Patient called to schedule for cough suspected bronchitis, stated he wants to establish at [facility], while discussing new patient appointment and advising urgent care for current symptoms patient stated  well if you can't see me today or tomorrow for this cough then I will just call my primary care provider. Patient then shared he has not been to his pcp in a while and wanted to come to [facility] temporary provider to evaluate cough.  Patient plans to call pcp for assistance.     1. ONSET: When did the cough begin?      Two weeks 2. SEVERITY: How bad is the cough today?      severe 3. SPUTUM: Describe the color of your sputum (e.g., none, dry cough; clear, white, yellow, green)     Yellow and greens  4. HEMOPTYSIS: Are you coughing up any blood? If Yes, ask: How much? (e.g., flecks, streaks, tablespoons, etc.)     Denies  5. DIFFICULTY BREATHING: Are you having difficulty breathing? If Yes, ask: How bad is it? (e.g., mild, moderate, severe)      Intermittent mild shortness of breath  6. FEVER: Do you have a fever? If Yes, ask: What is your temperature, how was it measured, and when did it start?     denies 7. CARDIAC HISTORY: Do you have any history of heart disease? (e.g., heart attack, congestive heart failure)       8. LUNG HISTORY: Do you have any history of lung disease?  (e.g., pulmonary embolus, asthma, emphysema)      9. PE RISK FACTORS: Do you have a history of blood clots? (or: recent major surgery,  recent prolonged travel, bedridden)      10. OTHER SYMPTOMS: Do you have any other symptoms? (e.g., runny nose, wheezing, chest pain)       denies 11. PREGNANCY: Is there any chance you are pregnant? When was your last menstrual period?        12. TRAVEL: Have you traveled out of the country in the last month? (e.g., travel history, exposures)  Protocols used: Cough - Acute Productive-A-AH

## 2024-10-22 ENCOUNTER — Ambulatory Visit: Payer: Self-pay

## 2024-10-22 NOTE — Telephone Encounter (Signed)
 Left pt a detailed message advised to call the office to schedule a New pt appointment

## 2024-11-09 NOTE — Telephone Encounter (Signed)
 Pt no longer want to est with dr fry he is back to [place]

## 2024-11-10 NOTE — Telephone Encounter (Signed)
 noted

## 2024-11-17 ENCOUNTER — Emergency Department
Admission: EM | Admit: 2024-11-17 | Discharge: 2024-11-17 | Discharge status: Against Medical Advice | Source: Home / Self Care

## 2024-11-18 ENCOUNTER — Other Ambulatory Visit: Payer: Self-pay

## 2024-11-18 ENCOUNTER — Other Ambulatory Visit (HOSPITAL_BASED_OUTPATIENT_CLINIC_OR_DEPARTMENT_OTHER): Payer: Self-pay

## 2024-11-18 ENCOUNTER — Encounter (HOSPITAL_BASED_OUTPATIENT_CLINIC_OR_DEPARTMENT_OTHER): Payer: Self-pay

## 2024-11-18 ENCOUNTER — Emergency Department (HOSPITAL_BASED_OUTPATIENT_CLINIC_OR_DEPARTMENT_OTHER): Admitting: Radiology

## 2024-11-18 ENCOUNTER — Emergency Department (HOSPITAL_BASED_OUTPATIENT_CLINIC_OR_DEPARTMENT_OTHER)
Admission: EM | Admit: 2024-11-18 | Discharge: 2024-11-18 | Disposition: A | Discharge status: Home / Self Care | Attending: Emergency Medicine | Admitting: Emergency Medicine

## 2024-11-18 DIAGNOSIS — S299XXA Unspecified injury of thorax, initial encounter: Secondary | ICD-10-CM | POA: Diagnosis present

## 2024-11-18 DIAGNOSIS — S20212A Contusion of left front wall of thorax, initial encounter: Secondary | ICD-10-CM | POA: Insufficient documentation

## 2024-11-18 DIAGNOSIS — S298XXA Other specified injuries of thorax, initial encounter: Secondary | ICD-10-CM

## 2024-11-18 DIAGNOSIS — Y9323 Activity, snow (alpine) (downhill) skiing, snow boarding, sledding, tobogganing and snow tubing: Secondary | ICD-10-CM | POA: Insufficient documentation

## 2024-11-18 MED ORDER — KETOROLAC TROMETHAMINE 15 MG/ML IJ SOLN
15.0000 mg | Freq: Once | INTRAMUSCULAR | Status: AC
  Administered 2024-11-18: 10:00:00 15 mg via INTRAMUSCULAR
  Filled 2024-11-18: qty 1

## 2024-11-18 MED ORDER — LIDOCAINE 5 % EX PTCH
1.0000 | MEDICATED_PATCH | CUTANEOUS | Status: DC
  Administered 2024-11-18: 10:00:00 1 via TRANSDERMAL
  Filled 2024-11-18: qty 1

## 2024-11-18 MED ORDER — CYCLOBENZAPRINE HCL 10 MG PO TABS
10.0000 mg | ORAL_TABLET | Freq: Two times a day (BID) | ORAL | 0 refills | Status: AC | PRN
  Filled 2024-11-18: qty 14, 7d supply, fill #0

## 2024-11-18 NOTE — Discharge Instructions (Addendum)
 Please read and follow all provided instructions.  Your diagnoses today include:  1. Rib contusion, left, initial encounter    Tests performed today include: Chest x-ray: Showed normal lungs and ribs, no definite fractures Vital signs. See below for your results today.   Medications prescribed:  Flexeril  (cyclobenzaprine ) - muscle relaxer medication  DO NOT drive or perform any activities that require you to be awake and alert because this medicine can make you drowsy.   Please use over-the-counter NSAID medications (ibuprofen , naproxen) or Tylenol  (acetaminophen ) as directed on the packaging for pain -- as long as you do not have any reasons avoid these medications. Reasons to avoid NSAID medications include: weak kidneys, a history of bleeding in your stomach or gut, or uncontrolled high blood pressure or previous heart attack. Reasons to avoid Tylenol  include: liver problems or ongoing alcohol use. Never take more than 4000mg  or 8 Extra strength Tylenol  in a 24 hour period.     Take any prescribed medications only as directed.  Home care instructions:  Follow any educational materials contained in this packet.  Take 10 deep breaths every hour to help fully inflate your lungs and prevent pneumonia.  BE VERY CAREFUL not to take multiple medicines containing Tylenol  (also called acetaminophen ). Doing so can lead to an overdose which can damage your liver and cause liver failure and possibly death.   Follow-up instructions: Please follow-up with your primary care provider in the next 7 days for further evaluation of your symptoms if not improving.   Return instructions:  Please return to the Emergency Department if you experience worsening symptoms.  Please return if you have any other emergent concerns.  Additional Information:  Your vital signs today were: BP (!) 141/108 (BP Location: Right Arm)   Pulse 89   Temp 97.9 F (36.6 C)   Resp 20   Ht 6' 6 (1.981 m)   Wt 108.9 kg    SpO2 99%   BMI 27.73 kg/m  If your blood pressure (BP) was elevated above 135/85 this visit, please have this repeated by your doctor within one month. --------------
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
## 2024-11-18 NOTE — ED Triage Notes (Signed)
 Snowboarding accident on Friday, left anterior chest pain. Pain worse with deep inspiration and movement. Denies any other pain. Negative LOC.
